# Patient Record
Sex: Male | Born: 1950 | Race: Black or African American | Hispanic: No | Smoking: Never smoker
Health system: Southern US, Community
[De-identification: ages and names within clinical notes are randomized; demographics above are authoritative.]

## PROBLEM LIST (undated history)

## (undated) ENCOUNTER — Emergency Department (HOSPITAL_COMMUNITY): Admission: EM | Payer: Self-pay | Source: Home / Self Care

## (undated) DIAGNOSIS — I10 Essential (primary) hypertension: Secondary | ICD-10-CM

## (undated) DIAGNOSIS — E669 Obesity, unspecified: Secondary | ICD-10-CM

## (undated) DIAGNOSIS — Z930 Tracheostomy status: Secondary | ICD-10-CM

## (undated) DIAGNOSIS — I4891 Unspecified atrial fibrillation: Secondary | ICD-10-CM

## (undated) DIAGNOSIS — J449 Chronic obstructive pulmonary disease, unspecified: Secondary | ICD-10-CM

## (undated) DIAGNOSIS — N289 Disorder of kidney and ureter, unspecified: Secondary | ICD-10-CM

## (undated) DIAGNOSIS — I82409 Acute embolism and thrombosis of unspecified deep veins of unspecified lower extremity: Secondary | ICD-10-CM

## (undated) DIAGNOSIS — K219 Gastro-esophageal reflux disease without esophagitis: Secondary | ICD-10-CM

---

## 2018-05-06 ENCOUNTER — Ambulatory Visit (HOSPITAL_COMMUNITY)
Admission: RE | Admit: 2018-05-06 | Discharge: 2018-05-06 | Disposition: A | Discharge status: Home / Self Care | Payer: Medicare Other | Source: Ambulatory Visit | Attending: Physical Medicine and Rehabilitation | Admitting: Physical Medicine and Rehabilitation

## 2018-05-06 ENCOUNTER — Other Ambulatory Visit (HOSPITAL_COMMUNITY): Payer: Self-pay | Admitting: Physical Medicine and Rehabilitation

## 2018-05-06 DIAGNOSIS — J90 Pleural effusion, not elsewhere classified: Secondary | ICD-10-CM | POA: Insufficient documentation

## 2018-05-06 DIAGNOSIS — J9811 Atelectasis: Secondary | ICD-10-CM | POA: Insufficient documentation

## 2018-05-06 DIAGNOSIS — I700 Atherosclerosis of aorta: Secondary | ICD-10-CM | POA: Insufficient documentation

## 2018-05-06 DIAGNOSIS — J9620 Acute and chronic respiratory failure, unspecified whether with hypoxia or hypercapnia: Secondary | ICD-10-CM | POA: Diagnosis not present

## 2018-05-06 DIAGNOSIS — R1084 Generalized abdominal pain: Secondary | ICD-10-CM

## 2018-05-06 DIAGNOSIS — R140 Abdominal distension (gaseous): Secondary | ICD-10-CM

## 2018-05-10 DIAGNOSIS — N183 Chronic kidney disease, stage 3 (moderate): Secondary | ICD-10-CM | POA: Diagnosis not present

## 2018-05-10 DIAGNOSIS — J449 Chronic obstructive pulmonary disease, unspecified: Secondary | ICD-10-CM

## 2018-05-10 DIAGNOSIS — J9620 Acute and chronic respiratory failure, unspecified whether with hypoxia or hypercapnia: Secondary | ICD-10-CM

## 2018-05-10 DIAGNOSIS — G4733 Obstructive sleep apnea (adult) (pediatric): Secondary | ICD-10-CM

## 2018-05-11 DIAGNOSIS — G4733 Obstructive sleep apnea (adult) (pediatric): Secondary | ICD-10-CM

## 2018-05-11 DIAGNOSIS — J449 Chronic obstructive pulmonary disease, unspecified: Secondary | ICD-10-CM | POA: Diagnosis not present

## 2018-05-11 DIAGNOSIS — N183 Chronic kidney disease, stage 3 (moderate): Secondary | ICD-10-CM

## 2018-05-11 DIAGNOSIS — J9620 Acute and chronic respiratory failure, unspecified whether with hypoxia or hypercapnia: Secondary | ICD-10-CM

## 2018-05-12 ENCOUNTER — Ambulatory Visit (HOSPITAL_COMMUNITY): Payer: Self-pay

## 2018-05-12 DIAGNOSIS — J9620 Acute and chronic respiratory failure, unspecified whether with hypoxia or hypercapnia: Secondary | ICD-10-CM

## 2018-05-12 DIAGNOSIS — N183 Chronic kidney disease, stage 3 (moderate): Secondary | ICD-10-CM

## 2018-05-12 DIAGNOSIS — J449 Chronic obstructive pulmonary disease, unspecified: Secondary | ICD-10-CM | POA: Diagnosis not present

## 2018-05-12 DIAGNOSIS — G4733 Obstructive sleep apnea (adult) (pediatric): Secondary | ICD-10-CM

## 2018-05-13 DIAGNOSIS — J9620 Acute and chronic respiratory failure, unspecified whether with hypoxia or hypercapnia: Secondary | ICD-10-CM

## 2018-05-13 DIAGNOSIS — G4733 Obstructive sleep apnea (adult) (pediatric): Secondary | ICD-10-CM | POA: Diagnosis not present

## 2018-05-13 DIAGNOSIS — J449 Chronic obstructive pulmonary disease, unspecified: Secondary | ICD-10-CM

## 2018-05-13 DIAGNOSIS — N183 Chronic kidney disease, stage 3 (moderate): Secondary | ICD-10-CM

## 2018-05-14 DIAGNOSIS — J9620 Acute and chronic respiratory failure, unspecified whether with hypoxia or hypercapnia: Secondary | ICD-10-CM

## 2018-05-14 DIAGNOSIS — N183 Chronic kidney disease, stage 3 (moderate): Secondary | ICD-10-CM

## 2018-05-14 DIAGNOSIS — G4733 Obstructive sleep apnea (adult) (pediatric): Secondary | ICD-10-CM

## 2018-05-14 DIAGNOSIS — J449 Chronic obstructive pulmonary disease, unspecified: Secondary | ICD-10-CM | POA: Diagnosis not present

## 2018-05-15 DIAGNOSIS — J449 Chronic obstructive pulmonary disease, unspecified: Secondary | ICD-10-CM | POA: Diagnosis not present

## 2018-05-15 DIAGNOSIS — N183 Chronic kidney disease, stage 3 (moderate): Secondary | ICD-10-CM | POA: Diagnosis not present

## 2018-05-15 DIAGNOSIS — G4733 Obstructive sleep apnea (adult) (pediatric): Secondary | ICD-10-CM

## 2018-05-15 DIAGNOSIS — J9620 Acute and chronic respiratory failure, unspecified whether with hypoxia or hypercapnia: Secondary | ICD-10-CM

## 2018-05-16 DIAGNOSIS — G4733 Obstructive sleep apnea (adult) (pediatric): Secondary | ICD-10-CM

## 2018-05-16 DIAGNOSIS — J9620 Acute and chronic respiratory failure, unspecified whether with hypoxia or hypercapnia: Secondary | ICD-10-CM | POA: Diagnosis not present

## 2018-05-16 DIAGNOSIS — N183 Chronic kidney disease, stage 3 (moderate): Secondary | ICD-10-CM | POA: Diagnosis not present

## 2018-05-16 DIAGNOSIS — J449 Chronic obstructive pulmonary disease, unspecified: Secondary | ICD-10-CM

## 2018-05-24 DIAGNOSIS — J9620 Acute and chronic respiratory failure, unspecified whether with hypoxia or hypercapnia: Secondary | ICD-10-CM

## 2018-05-24 DIAGNOSIS — N183 Chronic kidney disease, stage 3 (moderate): Secondary | ICD-10-CM

## 2018-05-24 DIAGNOSIS — J449 Chronic obstructive pulmonary disease, unspecified: Secondary | ICD-10-CM

## 2018-05-24 DIAGNOSIS — G4733 Obstructive sleep apnea (adult) (pediatric): Secondary | ICD-10-CM | POA: Diagnosis not present

## 2018-05-25 DIAGNOSIS — N183 Chronic kidney disease, stage 3 (moderate): Secondary | ICD-10-CM | POA: Diagnosis not present

## 2018-05-25 DIAGNOSIS — J9620 Acute and chronic respiratory failure, unspecified whether with hypoxia or hypercapnia: Secondary | ICD-10-CM

## 2018-05-25 DIAGNOSIS — J449 Chronic obstructive pulmonary disease, unspecified: Secondary | ICD-10-CM | POA: Diagnosis not present

## 2018-05-25 DIAGNOSIS — G4733 Obstructive sleep apnea (adult) (pediatric): Secondary | ICD-10-CM | POA: Diagnosis not present

## 2018-05-26 DIAGNOSIS — J449 Chronic obstructive pulmonary disease, unspecified: Secondary | ICD-10-CM | POA: Diagnosis not present

## 2018-05-26 DIAGNOSIS — N183 Chronic kidney disease, stage 3 (moderate): Secondary | ICD-10-CM

## 2018-05-26 DIAGNOSIS — J9620 Acute and chronic respiratory failure, unspecified whether with hypoxia or hypercapnia: Secondary | ICD-10-CM | POA: Diagnosis not present

## 2018-05-26 DIAGNOSIS — G4733 Obstructive sleep apnea (adult) (pediatric): Secondary | ICD-10-CM | POA: Diagnosis not present

## 2018-05-27 DIAGNOSIS — G4733 Obstructive sleep apnea (adult) (pediatric): Secondary | ICD-10-CM

## 2018-05-27 DIAGNOSIS — J9620 Acute and chronic respiratory failure, unspecified whether with hypoxia or hypercapnia: Secondary | ICD-10-CM

## 2018-05-27 DIAGNOSIS — J449 Chronic obstructive pulmonary disease, unspecified: Secondary | ICD-10-CM

## 2018-05-27 DIAGNOSIS — N183 Chronic kidney disease, stage 3 (moderate): Secondary | ICD-10-CM | POA: Diagnosis not present

## 2018-05-28 DIAGNOSIS — J449 Chronic obstructive pulmonary disease, unspecified: Secondary | ICD-10-CM

## 2018-05-28 DIAGNOSIS — N183 Chronic kidney disease, stage 3 (moderate): Secondary | ICD-10-CM

## 2018-05-28 DIAGNOSIS — G4733 Obstructive sleep apnea (adult) (pediatric): Secondary | ICD-10-CM

## 2018-05-28 DIAGNOSIS — J9620 Acute and chronic respiratory failure, unspecified whether with hypoxia or hypercapnia: Secondary | ICD-10-CM | POA: Diagnosis not present

## 2018-05-29 DIAGNOSIS — J449 Chronic obstructive pulmonary disease, unspecified: Secondary | ICD-10-CM

## 2018-05-29 DIAGNOSIS — J9620 Acute and chronic respiratory failure, unspecified whether with hypoxia or hypercapnia: Secondary | ICD-10-CM

## 2018-05-29 DIAGNOSIS — N183 Chronic kidney disease, stage 3 (moderate): Secondary | ICD-10-CM | POA: Diagnosis not present

## 2018-05-29 DIAGNOSIS — G4733 Obstructive sleep apnea (adult) (pediatric): Secondary | ICD-10-CM | POA: Diagnosis not present

## 2018-05-30 DIAGNOSIS — G4733 Obstructive sleep apnea (adult) (pediatric): Secondary | ICD-10-CM

## 2018-05-30 DIAGNOSIS — J9620 Acute and chronic respiratory failure, unspecified whether with hypoxia or hypercapnia: Secondary | ICD-10-CM | POA: Diagnosis not present

## 2018-05-30 DIAGNOSIS — J449 Chronic obstructive pulmonary disease, unspecified: Secondary | ICD-10-CM

## 2018-05-30 DIAGNOSIS — N183 Chronic kidney disease, stage 3 (moderate): Secondary | ICD-10-CM | POA: Diagnosis not present

## 2018-07-08 ENCOUNTER — Encounter (HOSPITAL_COMMUNITY): Payer: Self-pay | Admitting: Emergency Medicine

## 2018-07-08 ENCOUNTER — Emergency Department (HOSPITAL_COMMUNITY)
Admission: EM | Admit: 2018-07-08 | Discharge: 2018-07-08 | Disposition: A | Discharge status: Home / Self Care | Payer: Medicare Other | Attending: Emergency Medicine | Admitting: Emergency Medicine

## 2018-07-08 ENCOUNTER — Emergency Department (HOSPITAL_COMMUNITY): Payer: Medicare Other

## 2018-07-08 ENCOUNTER — Other Ambulatory Visit: Payer: Self-pay

## 2018-07-08 DIAGNOSIS — Y658 Other specified misadventures during surgical and medical care: Secondary | ICD-10-CM | POA: Diagnosis not present

## 2018-07-08 DIAGNOSIS — N189 Chronic kidney disease, unspecified: Secondary | ICD-10-CM | POA: Diagnosis not present

## 2018-07-08 DIAGNOSIS — R140 Abdominal distension (gaseous): Secondary | ICD-10-CM | POA: Diagnosis not present

## 2018-07-08 DIAGNOSIS — T85598A Other mechanical complication of other gastrointestinal prosthetic devices, implants and grafts, initial encounter: Secondary | ICD-10-CM | POA: Diagnosis not present

## 2018-07-08 DIAGNOSIS — R112 Nausea with vomiting, unspecified: Secondary | ICD-10-CM

## 2018-07-08 DIAGNOSIS — I129 Hypertensive chronic kidney disease with stage 1 through stage 4 chronic kidney disease, or unspecified chronic kidney disease: Secondary | ICD-10-CM | POA: Insufficient documentation

## 2018-07-08 DIAGNOSIS — K6389 Other specified diseases of intestine: Secondary | ICD-10-CM | POA: Insufficient documentation

## 2018-07-08 DIAGNOSIS — J449 Chronic obstructive pulmonary disease, unspecified: Secondary | ICD-10-CM | POA: Diagnosis not present

## 2018-07-08 DIAGNOSIS — Z931 Gastrostomy status: Secondary | ICD-10-CM | POA: Insufficient documentation

## 2018-07-08 HISTORY — DX: Obesity, unspecified: E66.9

## 2018-07-08 HISTORY — DX: Essential (primary) hypertension: I10

## 2018-07-08 HISTORY — DX: Gastro-esophageal reflux disease without esophagitis: K21.9

## 2018-07-08 HISTORY — DX: Chronic obstructive pulmonary disease, unspecified: J44.9

## 2018-07-08 HISTORY — DX: Acute embolism and thrombosis of unspecified deep veins of unspecified lower extremity: I82.409

## 2018-07-08 HISTORY — DX: Tracheostomy status: Z93.0

## 2018-07-08 HISTORY — DX: Unspecified atrial fibrillation: I48.91

## 2018-07-08 HISTORY — DX: Disorder of kidney and ureter, unspecified: N28.9

## 2018-07-08 LAB — COMPREHENSIVE METABOLIC PANEL
ALT: 20 U/L (ref 0–44)
ANION GAP: 16 — AB (ref 5–15)
AST: 27 U/L (ref 15–41)
Albumin: 3.6 g/dL (ref 3.5–5.0)
Alkaline Phosphatase: 124 U/L (ref 38–126)
BUN: 23 mg/dL (ref 8–23)
CALCIUM: 9.4 mg/dL (ref 8.9–10.3)
CHLORIDE: 98 mmol/L (ref 98–111)
CO2: 27 mmol/L (ref 22–32)
Creatinine, Ser: 1.95 mg/dL — ABNORMAL HIGH (ref 0.61–1.24)
GFR calc non Af Amer: 34 mL/min — ABNORMAL LOW (ref >60)
GFR, EST AFRICAN AMERICAN: 39 mL/min — AB (ref >60)
Glucose, Bld: 111 mg/dL — ABNORMAL HIGH (ref 70–99)
POTASSIUM: 5.2 mmol/L — AB (ref 3.5–5.1)
SODIUM: 141 mmol/L (ref 135–145)
Total Bilirubin: 1 mg/dL (ref 0.3–1.2)
Total Protein: 8.3 g/dL — ABNORMAL HIGH (ref 6.5–8.1)

## 2018-07-08 LAB — CBC
HCT: 41.9 % (ref 39.0–52.0)
HEMOGLOBIN: 12.9 g/dL — AB (ref 13.0–17.0)
MCH: 26.8 pg (ref 26.0–34.0)
MCHC: 30.8 g/dL (ref 30.0–36.0)
MCV: 86.9 fL (ref 78.0–100.0)
PLATELETS: 461 10*3/uL — AB (ref 150–400)
RBC: 4.82 MIL/uL (ref 4.22–5.81)
RDW: 17.2 % — ABNORMAL HIGH (ref 11.5–15.5)
WBC: 8.7 10*3/uL (ref 4.0–10.5)

## 2018-07-08 LAB — LIPASE, BLOOD: LIPASE: 31 U/L (ref 11–51)

## 2018-07-08 LAB — I-STAT CG4 LACTIC ACID, ED: Lactic Acid, Venous: 1.29 mmol/L (ref 0.5–1.9)

## 2018-07-08 MED ORDER — ONDANSETRON HCL 4 MG/2ML IJ SOLN
4.0000 mg | Freq: Once | INTRAMUSCULAR | Status: AC
  Administered 2018-07-08: 4 mg via INTRAVENOUS
  Filled 2018-07-08: qty 2

## 2018-07-08 MED ORDER — SODIUM CHLORIDE 0.9 % IV BOLUS
1000.0000 mL | Freq: Once | INTRAVENOUS | Status: AC
  Administered 2018-07-08: 1000 mL via INTRAVENOUS

## 2018-07-08 NOTE — Progress Notes (Signed)
Patient came in [facility] with a trach size #6 XLT, placed on above vent settings as per facility. SATS 100%, RR 28, BBS clear - diminished.

## 2018-07-08 NOTE — ED Triage Notes (Signed)
Arrived from [facility] for abdominal pain, emesis, and constipation. Patient alert answering and following commands appropriate. Ventilated patient trach, NG tube, and foley catheter in place prior to arrival.

## 2018-07-08 NOTE — ED Provider Notes (Signed)
[facility] Provider Note   CSN: [id number] Arrival date & time: 07/08/18  1305     History   Chief Complaint Chief Complaint  Patient presents with  . Abdominal Pain  . Emesis    HPI Alex Reilly is a 67 y.o. male.  67 year old male with extensive past medical history including atrial fibrillation, COPD, chronic respiratory failure s/p trach, g-tube dependence, CKD, HTN who p/w vomiting.  History limited due to the fact that the patient has trach. [facility] has reportedly had nausea, vomiting over the past day despite having an NG tube in place.  At one point we received a report of diarrhea but the patient states that he has not had a bowel movement in a long time.  He has significant abdominal distention and has been having some abdominal pain although he denies any significant pain currently.  LEVEL 5 CAVEAT DUE TO DIFFICULTY WITH SPEECH FROM TRACHEOSTOMY  The history is provided by the patient, the EMS personnel and the nursing home.  Abdominal Pain   Associated symptoms include vomiting.  Emesis   Associated symptoms include abdominal pain.    Past Medical History:  Diagnosis Date  . Atrial fibrillation (HCC)   . COPD (chronic obstructive pulmonary disease) (HCC)   . DVT (deep venous thrombosis) (HCC)   . GERD (gastroesophageal reflux disease)   . Hypertension   . Obesity   . Renal disorder   . Tracheostomy in place [facility](HCC)     There are no active problems to display for this patient.         Home Medications    Prior to Admission medications   Not on File    Family History No family history on file.  Social History Social History   Tobacco Use  . Smoking status: Not on file  Substance Use Topics  . Alcohol use: Not on file  . Drug use: Not on file  no alcohol, drug, or tobacco use Lives at [facility]   Allergies   Patient has no known allergies.   Review  of Systems Review of Systems  Unable to perform ROS: Patient nonverbal  Gastrointestinal: Positive for abdominal pain and vomiting.  difficulty with speech due to tracheostomy   Physical Exam Updated Vital Signs BP 129/82 (BP Location: Left Arm)   Pulse 75   Resp 19   Ht 5\' 8"  (1.727 m)   Wt (!) 163.3 kg (360 lb)   SpO2 97%   BMI 54.74 kg/m   Physical Exam  Constitutional: He is oriented to person, place, and time. He appears well-developed.  Chronically ill appearing, alert, NAD  HENT:  Head: Normocephalic and atraumatic.  Poor dentition, NGT in place  Eyes: Pupils are equal, round, and reactive to light. Conjunctivae are normal.  Neck: Neck supple.  Cardiovascular: Normal rate, regular rhythm and normal heart sounds.  No murmur heard. Pulmonary/Chest: Effort normal.  Coarse breath sounds b/l, on vent with trach in place  Abdominal: He exhibits distension (severe). Bowel sounds are decreased. There is no tenderness.  Musculoskeletal: He exhibits no edema.  Neurological: He is alert and oriented to person, place, and time.  Fluent speech  Skin: Skin is warm and dry.  Psychiatric: He has a normal mood and affect. Judgment normal.  Nursing note and vitals reviewed.    ED Treatments / Results  Labs (all labs ordered are listed, but only abnormal results are displayed) Labs Reviewed  COMPREHENSIVE METABOLIC PANEL - Abnormal; Notable for the following components:      Result Value   Potassium 5.2 (*)    Glucose, Bld 111 (*)    Creatinine, Ser 1.95 (*)    Total Protein 8.3 (*)    GFR calc non Af Amer 34 (*)    GFR calc Af Amer 39 (*)    Anion gap 16 (*)    All other components within normal limits  CBC - Abnormal; Notable for the following components:   Hemoglobin 12.9 (*)    RDW 17.2 (*)    Platelets 461 (*)    All other components within normal limits  LIPASE, BLOOD  I-STAT CG4 LACTIC ACID, ED    EKG EKG Interpretation  Date/Time:  Thursday July 08 2018  13:10:20 EDT Ventricular Rate:  93 PR Interval:    QRS Duration: 110 QT Interval:  420 QTC Calculation: 512 R Axis:   -71 Text Interpretation:  Sinus rhythm Paired ventricular premature complexes Consider left atrial enlargement Left axis deviation Low voltage, precordial leads Abnormal R-wave progression, late transition Prolonged QT interval No previous ECGs available Confirmed by Frederick Peers [phone]) on 07/08/2018 2:24:07 PM   Radiology Dg Chest Portable 1 View  Result Date: 07/08/2018 CLINICAL DATA:  Nasogastric tube placement for vomiting EXAM: PORTABLE CHEST 1 VIEW COMPARISON:  None. FINDINGS: Nasogastric tube tip is at the level of the gastroesophageal junction. Tracheostomy catheter tip is 5.8 cm above the carina. No pneumothorax. There is bibasilar atelectasis. No consolidation or edema evident. Heart is mildly enlarged with pulmonary vascularity normal. No adenopathy. There is aortic atherosclerosis. No bone lesions. IMPRESSION: Nasogastric tube tip is at the gastroesophageal junction. Advise advancing nasogastric tube 10-12 cm. Tracheostomy tip is 5.8 cm above the carina. No pneumothorax. Heart is mildly enlarged.  Pulmonary vascularity normal. Bibasilar atelectasis.  No edema or consolidation. There is aortic atherosclerosis. Aortic Atherosclerosis (ICD10-I70.0). Electronically Signed   By: Bretta Bang III M.D.   On: 07/08/2018 13:41   Dg Abd Portable 1 View  Result Date: 07/08/2018 CLINICAL DATA:  NG tube advancement. EXAM: PORTABLE ABDOMEN - 1 VIEW COMPARISON:  07/08/2018 at 1328 hours FINDINGS: The enteric tube has been advanced and now projects over the proximal stomach with side hole also below the hemidiaphragm. Marked gaseous dilatation of the colon and milder small-bowel dilatation are again partially visualized. An IVC filter is noted. IMPRESSION: Advancement of enteric tube into the proximal stomach. Electronically Signed   By: Sebastian Ache M.D.   On: 07/08/2018 14:50    Dg Abd Portable 1 View  Result Date: 07/08/2018 CLINICAL DATA:  NG tube placement. EXAM: PORTABLE ABDOMEN - 1 VIEW COMPARISON:  CT, 05/06/2018 FINDINGS: NG tube tip projects at the level of the gastroesophageal junction. Stomach is moderately distended with air. Recommend inserting the 2 another 15-20 cm for more optimal positioning well within the stomach. There are dilated loops small bowel. IMPRESSION: NG tube tip projects at the gastroesophageal junction. Recommend inserting 15-24 cm. Gastric distention and small bowel dilation which suggests a partial small bowel obstruction. Bowel is incompletely imaged. Electronically Signed   By: Amie Portland M.D.   On: 07/08/2018 13:43    Procedures Procedures (including critical care time)  Medications Ordered in ED Medications  sodium chloride 0.9 % bolus 1,000 mL (1,000 mLs Intravenous New Bag/Given 07/08/18 1449)  ondansetron (ZOFRAN) injection 4 mg (4 mg Intravenous Given 07/08/18 1335)     Initial Impression / Assessment and Plan / ED Course  I have reviewed the triage vital signs and the nursing notes.  Pertinent labs & imaging results that were available during my care of the patient were reviewed by me and considered in my medical decision making (see chart for details).    Pt alert, NAD on arrival, stable VS, on vent with no respiratory distress. Severe abd distension noted on exam without focal tenderness. Denied severe abd pain.  Labs show normal lactate, potassium 5.2 but hemolyzed, creatinine 1.95, normal LFTs, reassuring CBC.  Plain films show NG tube at GE junction with gastric distention and small bowel dilatation, suggesting SBO. Advanced NGT with improvement on repeat films. Placed on suction.  I have ordered CT of abdomen and pelvis to evaluate for bowel obstruction, ileus, or other acute process to explain his bowel distention.  I am signing out to the oncoming provider, Dr. Particia Nearing, who will follow up on imaging.  Final  Clinical Impressions(s) / ED Diagnoses   Final diagnoses:  None    ED Discharge Orders    None       Matti Minney, Ambrose Finland, MD 07/08/18 [phone]

## 2018-07-08 NOTE — ED Provider Notes (Signed)
Pt signed out from Dr. Clarene DukeLittle pending result of CT scans.  IMPRESSION: Persistent colonic dilatation similar to that seen on the prior exam involving primarily the transverse colon. No obstructing lesions are seen. Some associated fluid-filled prominent small bowel loops are noted without definitive obstructing change.  This has been going on since at least 5/23 when he had his last CT scan.  I think he was vomiting today b/c his NG was malpositioned.  It is now positioned correctly.  As this is not an acute issue, I don't think he needs to stay, but he will need to see GI.  Return if worse.   Jacalyn LefevreHaviland, Darianny Momon, MD 07/08/18 [phone]

## 2018-07-08 NOTE — ED Notes (Signed)
NG advanced 12 cm per MD orders

## 2018-07-08 NOTE — ED Notes (Signed)
[facility] called to transport pt back to [facility]

## 2018-08-19 ENCOUNTER — Emergency Department (HOSPITAL_COMMUNITY): Payer: Medicare Other

## 2018-08-19 ENCOUNTER — Other Ambulatory Visit: Payer: Self-pay

## 2018-08-19 ENCOUNTER — Inpatient Hospital Stay (HOSPITAL_COMMUNITY)
Admission: EM | Admit: 2018-08-19 | Discharge: 2018-08-26 | DRG: 698 | Disposition: A | Discharge status: Other Acute Inpatient Hospital | Payer: Medicare Other | Attending: Internal Medicine | Admitting: Internal Medicine

## 2018-08-19 ENCOUNTER — Encounter (HOSPITAL_COMMUNITY): Payer: Self-pay

## 2018-08-19 DIAGNOSIS — N183 Chronic kidney disease, stage 3 (moderate): Secondary | ICD-10-CM | POA: Diagnosis not present

## 2018-08-19 DIAGNOSIS — B9620 Unspecified Escherichia coli [E. coli] as the cause of diseases classified elsewhere: Secondary | ICD-10-CM | POA: Diagnosis present

## 2018-08-19 DIAGNOSIS — Y846 Urinary catheterization as the cause of abnormal reaction of the patient, or of later complication, without mention of misadventure at the time of the procedure: Secondary | ICD-10-CM | POA: Diagnosis present

## 2018-08-19 DIAGNOSIS — Z930 Tracheostomy status: Secondary | ICD-10-CM | POA: Diagnosis not present

## 2018-08-19 DIAGNOSIS — Z9911 Dependence on respirator [ventilator] status: Secondary | ICD-10-CM

## 2018-08-19 DIAGNOSIS — J9611 Chronic respiratory failure with hypoxia: Secondary | ICD-10-CM | POA: Diagnosis not present

## 2018-08-19 DIAGNOSIS — Z7982 Long term (current) use of aspirin: Secondary | ICD-10-CM

## 2018-08-19 DIAGNOSIS — M109 Gout, unspecified: Secondary | ICD-10-CM | POA: Diagnosis not present

## 2018-08-19 DIAGNOSIS — Z7951 Long term (current) use of inhaled steroids: Secondary | ICD-10-CM | POA: Diagnosis not present

## 2018-08-19 DIAGNOSIS — K567 Ileus, unspecified: Secondary | ICD-10-CM | POA: Diagnosis present

## 2018-08-19 DIAGNOSIS — A419 Sepsis, unspecified organism: Secondary | ICD-10-CM | POA: Diagnosis present

## 2018-08-19 DIAGNOSIS — N390 Urinary tract infection, site not specified: Secondary | ICD-10-CM | POA: Diagnosis not present

## 2018-08-19 DIAGNOSIS — Z79890 Hormone replacement therapy (postmenopausal): Secondary | ICD-10-CM

## 2018-08-19 DIAGNOSIS — Z6841 Body Mass Index (BMI) 40.0 and over, adult: Secondary | ICD-10-CM

## 2018-08-19 DIAGNOSIS — Z888 Allergy status to other drugs, medicaments and biological substances status: Secondary | ICD-10-CM

## 2018-08-19 DIAGNOSIS — T83511A Infection and inflammatory reaction due to indwelling urethral catheter, initial encounter: Principal | ICD-10-CM | POA: Diagnosis present

## 2018-08-19 DIAGNOSIS — E876 Hypokalemia: Secondary | ICD-10-CM | POA: Diagnosis present

## 2018-08-19 DIAGNOSIS — J449 Chronic obstructive pulmonary disease, unspecified: Secondary | ICD-10-CM | POA: Diagnosis not present

## 2018-08-19 DIAGNOSIS — E860 Dehydration: Secondary | ICD-10-CM | POA: Diagnosis not present

## 2018-08-19 DIAGNOSIS — Z23 Encounter for immunization: Secondary | ICD-10-CM | POA: Diagnosis not present

## 2018-08-19 DIAGNOSIS — I480 Paroxysmal atrial fibrillation: Secondary | ICD-10-CM | POA: Diagnosis not present

## 2018-08-19 DIAGNOSIS — D649 Anemia, unspecified: Secondary | ICD-10-CM | POA: Diagnosis present

## 2018-08-19 DIAGNOSIS — I129 Hypertensive chronic kidney disease with stage 1 through stage 4 chronic kidney disease, or unspecified chronic kidney disease: Secondary | ICD-10-CM | POA: Diagnosis present

## 2018-08-19 DIAGNOSIS — R6521 Severe sepsis with septic shock: Secondary | ICD-10-CM | POA: Diagnosis present

## 2018-08-19 DIAGNOSIS — Z79899 Other long term (current) drug therapy: Secondary | ICD-10-CM

## 2018-08-19 DIAGNOSIS — K219 Gastro-esophageal reflux disease without esophagitis: Secondary | ICD-10-CM | POA: Diagnosis present

## 2018-08-19 DIAGNOSIS — Z86718 Personal history of other venous thrombosis and embolism: Secondary | ICD-10-CM

## 2018-08-19 DIAGNOSIS — Z885 Allergy status to narcotic agent status: Secondary | ICD-10-CM

## 2018-08-19 DIAGNOSIS — N289 Disorder of kidney and ureter, unspecified: Secondary | ICD-10-CM

## 2018-08-19 LAB — URINALYSIS, ROUTINE W REFLEX MICROSCOPIC
BILIRUBIN URINE: NEGATIVE
Glucose, UA: NEGATIVE mg/dL
Ketones, ur: NEGATIVE mg/dL
NITRITE: POSITIVE — AB
PH: 7 (ref 5.0–8.0)
Protein, ur: 30 mg/dL — AB
SPECIFIC GRAVITY, URINE: 1.015 (ref 1.005–1.030)

## 2018-08-19 LAB — COMPREHENSIVE METABOLIC PANEL
ALT: 29 U/L (ref 0–44)
AST: 29 U/L (ref 15–41)
Albumin: 2.9 g/dL — ABNORMAL LOW (ref 3.5–5.0)
Alkaline Phosphatase: 213 U/L — ABNORMAL HIGH (ref 38–126)
Anion gap: 11 (ref 5–15)
BUN: 19 mg/dL (ref 8–23)
CHLORIDE: 105 mmol/L (ref 98–111)
CO2: 25 mmol/L (ref 22–32)
Calcium: 8.8 mg/dL — ABNORMAL LOW (ref 8.9–10.3)
Creatinine, Ser: 1.58 mg/dL — ABNORMAL HIGH (ref 0.61–1.24)
GFR calc Af Amer: 51 mL/min — ABNORMAL LOW (ref >60)
GFR, EST NON AFRICAN AMERICAN: 44 mL/min — AB (ref >60)
Glucose, Bld: 102 mg/dL — ABNORMAL HIGH (ref 70–99)
Potassium: 4.1 mmol/L (ref 3.5–5.1)
Sodium: 141 mmol/L (ref 135–145)
Total Bilirubin: 0.9 mg/dL (ref 0.3–1.2)
Total Protein: 7.1 g/dL (ref 6.5–8.1)

## 2018-08-19 LAB — I-STAT ARTERIAL BLOOD GAS, ED
ACID-BASE EXCESS: 2 mmol/L (ref 0.0–2.0)
BICARBONATE: 25.6 mmol/L (ref 20.0–28.0)
O2 SAT: 100 %
TCO2: 27 mmol/L (ref 22–32)
pCO2 arterial: 38.9 mmHg (ref 32.0–48.0)
pH, Arterial: 7.435 (ref 7.350–7.450)
pO2, Arterial: 404 mmHg — ABNORMAL HIGH (ref 83.0–108.0)

## 2018-08-19 LAB — CBC WITH DIFFERENTIAL/PLATELET
ABS IMMATURE GRANULOCYTES: 0.1 10*3/uL (ref 0.0–0.1)
BASOS ABS: 0 10*3/uL (ref 0.0–0.1)
Basophils Relative: 0 %
Eosinophils Absolute: 0 10*3/uL (ref 0.0–0.7)
Eosinophils Relative: 0 %
HCT: 35.5 % — ABNORMAL LOW (ref 39.0–52.0)
HEMOGLOBIN: 11 g/dL — AB (ref 13.0–17.0)
Immature Granulocytes: 0 %
LYMPHS PCT: 2 %
Lymphs Abs: 0.3 10*3/uL — ABNORMAL LOW (ref 0.7–4.0)
MCH: 27.3 pg (ref 26.0–34.0)
MCHC: 31 g/dL (ref 30.0–36.0)
MCV: 88.1 fL (ref 78.0–100.0)
Monocytes Absolute: 0.2 10*3/uL (ref 0.1–1.0)
Monocytes Relative: 2 %
Neutro Abs: 12.2 10*3/uL — ABNORMAL HIGH (ref 1.7–7.7)
Neutrophils Relative %: 96 %
Platelets: 298 10*3/uL (ref 150–400)
RBC: 4.03 MIL/uL — ABNORMAL LOW (ref 4.22–5.81)
RDW: 16.7 % — ABNORMAL HIGH (ref 11.5–15.5)
WBC: 12.9 10*3/uL — ABNORMAL HIGH (ref 4.0–10.5)

## 2018-08-19 LAB — I-STAT CG4 LACTIC ACID, ED
LACTIC ACID, VENOUS: 1.6 mmol/L (ref 0.5–1.9)
Lactic Acid, Venous: 1.17 mmol/L (ref 0.5–1.9)

## 2018-08-19 LAB — PROTIME-INR
INR: 1.08
PROTHROMBIN TIME: 13.9 s (ref 11.4–15.2)

## 2018-08-19 MED ORDER — SODIUM CHLORIDE 0.9 % IV SOLN
2.0000 g | Freq: Once | INTRAVENOUS | Status: AC
  Administered 2018-08-19: 2 g via INTRAVENOUS
  Filled 2018-08-19: qty 2

## 2018-08-19 MED ORDER — IOPAMIDOL (ISOVUE-300) INJECTION 61%
100.0000 mL | Freq: Once | INTRAVENOUS | Status: AC | PRN
  Administered 2018-08-19: 100 mL via INTRAVENOUS

## 2018-08-19 MED ORDER — IOPAMIDOL (ISOVUE-300) INJECTION 61%
INTRAVENOUS | Status: AC
  Filled 2018-08-19: qty 100

## 2018-08-19 MED ORDER — VANCOMYCIN HCL 10 G IV SOLR
2500.0000 mg | Freq: Once | INTRAVENOUS | Status: AC
  Administered 2018-08-19: 2500 mg via INTRAVENOUS
  Filled 2018-08-19: qty 2500

## 2018-08-19 MED ORDER — ACETAMINOPHEN 650 MG RE SUPP
650.0000 mg | Freq: Once | RECTAL | Status: AC
  Administered 2018-08-19: 650 mg via RECTAL
  Filled 2018-08-19: qty 1

## 2018-08-19 MED ORDER — SODIUM CHLORIDE 0.9 % IV SOLN
2.0000 g | Freq: Three times a day (TID) | INTRAVENOUS | Status: DC
  Administered 2018-08-20 – 2018-08-22 (×7): 2 g via INTRAVENOUS
  Filled 2018-08-19 (×8): qty 2

## 2018-08-19 MED ORDER — VANCOMYCIN HCL 10 G IV SOLR
1750.0000 mg | INTRAVENOUS | Status: DC
  Administered 2018-08-20: 1750 mg via INTRAVENOUS
  Filled 2018-08-19 (×2): qty 1750

## 2018-08-19 MED ORDER — SODIUM CHLORIDE 0.9 % IV BOLUS
700.0000 mL | Freq: Once | INTRAVENOUS | Status: AC
  Administered 2018-08-19: 700 mL via INTRAVENOUS

## 2018-08-19 MED ORDER — SODIUM CHLORIDE 0.9 % IV BOLUS
1000.0000 mL | Freq: Once | INTRAVENOUS | Status: AC
  Administered 2018-08-19: 1000 mL via INTRAVENOUS

## 2018-08-19 MED ORDER — METRONIDAZOLE IN NACL 5-0.79 MG/ML-% IV SOLN
500.0000 mg | Freq: Three times a day (TID) | INTRAVENOUS | Status: DC
  Administered 2018-08-19: 500 mg via INTRAVENOUS
  Filled 2018-08-19 (×2): qty 100

## 2018-08-19 MED ORDER — VANCOMYCIN HCL IN DEXTROSE 1-5 GM/200ML-% IV SOLN: 1000.0000 mg | Freq: Once | INTRAVENOUS | Status: DC

## 2018-08-19 NOTE — ED Notes (Signed)
Re paged critical care

## 2018-08-19 NOTE — ED Provider Notes (Signed)
[facility] EMERGENCY DEPARTMENT Provider Note   CSN: [id number] Arrival date & time: 08/19/18  1853     History   Chief Complaint Chief Complaint  Patient presents with  . Fever    HPI Alex Reilly is a 67 y.o. male.  Patient is a 67 year old male with a history of COPD, respiratory failure status post tracheostomy.  He is trach dependent.  He has a history of hypertension, atrial fibrillation and chronic abdominal distention.  He receives feedings by an NG tube.  He resides at [facility].  He was noted today to have a fever of 103.  His NG tube started backing up and he had some vomiting.  He has chronic abdominal distention and states it is not any different than his baseline.  He denies any abdominal pain.  He has had a little bit of a cough.  He states he is having normal bowel movements.  He has a chronic indwelling Foley catheter but denies any bladder pain.  History is a little bit limited as he is nonverbal given his trach.  He complains of a headache but is denying any other complaints of pain.     Past Medical History:  Diagnosis Date  . Atrial fibrillation (HCC)   . COPD (chronic obstructive pulmonary disease) (HCC)   . DVT (deep venous thrombosis) (HCC)   . GERD (gastroesophageal reflux disease)   . Hypertension   . Obesity   . Renal disorder   . Tracheostomy in place [facility])     There are no active problems to display for this patient.   History reviewed. No pertinent surgical history.      Home Medications    Prior to Admission medications   Not on File    Family History History reviewed. No pertinent family history.  Social History Social History   Tobacco Use  . Smoking status: Never Smoker  . Smokeless tobacco: Never Used  Substance Use Topics  . Alcohol use: Not on file  . Drug use: Not on file     Allergies   Patient has no known allergies.   Review of Systems Review of Systems  Constitutional: Positive  for fever. Negative for chills, diaphoresis and fatigue.  HENT: Negative for congestion, rhinorrhea and sneezing.   Eyes: Negative.   Respiratory: Negative for cough, chest tightness and shortness of breath.   Cardiovascular: Negative for chest pain and leg swelling.  Gastrointestinal: Positive for abdominal distention, nausea and vomiting. Negative for abdominal pain, blood in stool and diarrhea.  Genitourinary: Negative for difficulty urinating, flank pain, frequency and hematuria.  Musculoskeletal: Negative for arthralgias and back pain.  Skin: Negative for rash.  Neurological: Positive for headaches. Negative for dizziness, speech difficulty, weakness and numbness.     Physical Exam Updated Vital Signs BP (!) 94/49   Pulse 70   Temp (!) 102.1 F (38.9 C) (Oral)   Resp (!) 23   Wt (!) 170 kg   SpO2 99%   BMI 56.99 kg/m   Physical Exam  Constitutional: He is oriented to person, place, and time. He appears well-developed and well-nourished.  HENT:  Head: Normocephalic and atraumatic.  Eyes: Pupils are equal, round, and reactive to light.  Neck: Normal range of motion. Neck supple.  Tracheostomy in place, patient is on the vent  Cardiovascular: Regular rhythm and normal heart sounds. Tachycardia present.  Pulmonary/Chest: Effort normal. No respiratory distress. He has no wheezes. He has rales. He exhibits no tenderness.  Abdominal: Soft. Bowel sounds are normal. He exhibits distension. There is no tenderness. There is no rebound and no guarding.  Patient has marked abdominal distention with tympany to percussion.  He denies any pain on exam  Genitourinary:  Genitourinary Comments: No signs of wounds to his scrotal area.  No buttocks wounds are noted  Musculoskeletal: Normal range of motion. He exhibits no edema.  Lymphadenopathy:    He has no cervical adenopathy.  Neurological: He is alert and oriented to person, place, and time.  Skin: Skin is warm and dry. No rash noted.    Psychiatric: He has a normal mood and affect.     ED Treatments / Results  Labs (all labs ordered are listed, but only abnormal results are displayed) Labs Reviewed  COMPREHENSIVE METABOLIC PANEL - Abnormal; Notable for the following components:      Result Value   Glucose, Bld 102 (*)    Creatinine, Ser 1.58 (*)    Calcium 8.8 (*)    Albumin 2.9 (*)    Alkaline Phosphatase 213 (*)    GFR calc non Af Amer 44 (*)    GFR calc Af Amer 51 (*)    All other components within normal limits  CBC WITH DIFFERENTIAL/PLATELET - Abnormal; Notable for the following components:   WBC 12.9 (*)    RBC 4.03 (*)    Hemoglobin 11.0 (*)    HCT 35.5 (*)    RDW 16.7 (*)    Neutro Abs 12.2 (*)    Lymphs Abs 0.3 (*)    All other components within normal limits  URINALYSIS, ROUTINE W REFLEX MICROSCOPIC - Abnormal; Notable for the following components:   APPearance CLOUDY (*)    Hgb urine dipstick MODERATE (*)    Protein, ur 30 (*)    Nitrite POSITIVE (*)    Leukocytes, UA MODERATE (*)    Bacteria, UA MANY (*)    All other components within normal limits  I-STAT ARTERIAL BLOOD GAS, ED - Abnormal; Notable for the following components:   pO2, Arterial 404.0 (*)    All other components within normal limits  CULTURE, BLOOD (ROUTINE X 2)  CULTURE, BLOOD (ROUTINE X 2)  URINE CULTURE  PROTIME-INR  I-STAT CG4 LACTIC ACID, ED  I-STAT CG4 LACTIC ACID, ED    EKG EKG Interpretation  Date/Time:  Thursday August 19 2018 23:05:46 EDT Ventricular Rate:  77 PR Interval:    QRS Duration: 112 QT Interval:  471 QTC Calculation: 534 R Axis:   -31 Text Interpretation:  Sinus rhythm Incomplete RBBB and LAFB Low voltage, extremity and precordial leads Prolonged QT interval Baseline wander in lead(s) I II aVR since last tracing no significant change Confirmed by Rolan Bucco [phone]) on 08/19/2018 11:09:25 PM   Radiology Ct Abdomen Pelvis W Contrast  Addendum Date: 08/19/2018   ADDENDUM REPORT:  08/19/2018 21:25 ADDENDUM: The IMPRESSION should also include: Foley catheter tip and balloon within the urethra. Recommend advancement into the bladder after balloon deflation. Electronically Signed   By: Harmon Pier M.D.   On: 08/19/2018 21:25   Result Date: 08/19/2018 CLINICAL DATA:  67 year old male with acute abdominal pain with fever. EXAM: CT ABDOMEN AND PELVIS WITH CONTRAST TECHNIQUE: Multidetector CT imaging of the abdomen and pelvis was performed using the standard protocol following bolus administration of intravenous contrast. CONTRAST:  ISOVUE-300 IOPAMIDOL (ISOVUE-300) INJECTION 61% COMPARISON:  07/08/2018 and prior CTs FINDINGS: Lower chest: Cardiomegaly, coronary artery disease and bibasilar atelectasis again noted. Hepatobiliary: No significant hepatic  or gallbladder abnormalities. No biliary dilatation. Pancreas: Unremarkable Spleen: Unremarkable Adrenals/Urinary Tract: The kidneys, adrenal glands and bladder are unremarkable. A Foley catheter is noted with tip and inflated balloon in the urethra. Stomach/Bowel: NG tube within the stomach is noted. Again noted is marked dilatation of the transverse colon and filled mainly with gas. Fluid within portions of the ascending and transverse colon are noted. The transverse colon measures up to 16 cm in greatest diameter. No small bowel dilatation identified. No definite bowel wall thickening or inflammatory changes noted. Vascular/Lymphatic: Aortic atherosclerosis. An IVC filter is again noted. No enlarged abdominal or pelvic lymph nodes. Reproductive: No significant prostate abnormalities. Other: No free fluid, abscess or pneumoperitoneum. Musculoskeletal: No acute or suspicious bony abnormalities. IMPRESSION: 1. Unchanged marked dilatation of the colon again noted (primarily transverse) containing gas and some fluid without obstructing cause. This is unchanged from 05/06/2018 likely representing a chronic ileus. No evidence of small bowel  obstruction, abscess or pneumoperitoneum. 2. No acute abnormalities identified. 3. Cardiomegaly, coronary artery disease and unchanged mild bibasilar atelectasis. 4.  Aortic Atherosclerosis (ICD10-I70.0). Electronically Signed: By: Harmon Pier M.D. On: 08/19/2018 21:17   Dg Chest Portable 1 View  Result Date: 08/19/2018 CLINICAL DATA:  67 year old male with new onset fever EXAM: PORTABLE CHEST 1 VIEW COMPARISON:  Prior chest x-ray 07/08/2018 FINDINGS: Stable cardiomegaly. Mediastinal contours are unchanged. The patient is intubated. The endotracheal tube tip is 5.3 cm from the carina. A gastric tube is present. The tip lies below the diaphragm, presumably in the stomach. Similar appearance of the lungs with low inspiratory volumes and chronic elevation of the right hemidiaphragm. Nonspecific bibasilar opacities may reflect atelectasis or infiltrate but appears similar compared to prior. Mild vascular congestion, no overt edema. IMPRESSION: 1. The tip of the endotracheal tube is 5.3 cm above the carina. 2. The tip of the gastric tube lies inferior to the diaphragm, presumably within the stomach 3. Stable appearance of the chest with chronic elevation of the right hemidiaphragm, overall low inspiratory volumes and probable bibasilar atelectasis. Electronically Signed   By: Malachy Moan M.D.   On: 08/19/2018 19:34    Procedures Procedures (including critical care time)  Medications Ordered in ED Medications  metroNIDAZOLE (FLAGYL) IVPB 500 mg (0 mg Intravenous Stopped 08/19/18 2103)  iopamidol (ISOVUE-300) 61 % injection (has no administration in time range)  vancomycin (VANCOCIN) 1,750 mg in sodium chloride 0.9 % 500 mL IVPB (has no administration in time range)  ceFEPIme (MAXIPIME) 2 g in sodium chloride 0.9 % 100 mL IVPB (has no administration in time range)  sodium chloride 0.9 % bolus 1,000 mL (0 mLs Intravenous Stopped 08/19/18 2055)  ceFEPIme (MAXIPIME) 2 g in sodium chloride 0.9 % 100 mL IVPB  (0 g Intravenous Stopped 08/19/18 2132)  vancomycin (VANCOCIN) 2,500 mg in sodium chloride 0.9 % 500 mL IVPB (2,500 mg Intravenous New Bag/Given 08/19/18 2139)  sodium chloride 0.9 % bolus 700 mL (0 mLs Intravenous Stopped 08/19/18 2101)  acetaminophen (TYLENOL) suppository 650 mg (650 mg Rectal Given 08/19/18 2005)  iopamidol (ISOVUE-300) 61 % injection 100 mL (100 mLs Intravenous Contrast Given 08/19/18 2033)     Initial Impression / Assessment and Plan / ED Course  I have reviewed the triage vital signs and the nursing notes.  Pertinent labs & imaging results that were available during my care of the patient were reviewed by me and considered in my medical decision making (see chart for details).     Patient is a 67 year old male  who presents from [facility] with criteria for sepsis.  He is febrile and hypotensive.  Code sepsis was called and he was given broad-spectrum antibiotics and IV fluids.  He was given 30 cc/kg fluid bolus based on his ideal body weight which was 1700 cc total.  His chest x-ray is clear without evidence of pneumonia or aspiration.  His abdomen is markedly distended but CT scan shows no evidence of acute abnormality.  His NG tube was hooked to suction.  He does have evidence of a urinary tract infection and his urine was sent for culture.  I do not see any evidence of cellulitis or other suggestions of infection.  Given that he is vent dependent, CCM was consulted for admission.  He was hypotensive on arrival but his blood pressure has responded to IV fluids.  His white count is elevated.  His creatinine is elevated but similar to prior values.  His other labs are non-concerning.  Pt discussed with Dr. Warrick Parisian.  CRITICAL CARE Performed by: Rolan Bucco Total critical care time: 70 minutes Critical care time was exclusive of separately billable procedures and treating other patients. Critical care was necessary to treat or prevent imminent or life-threatening  deterioration. Critical care was time spent personally by me on the following activities: development of treatment plan with patient and/or surrogate as well as nursing, discussions with consultants, evaluation of patient's response to treatment, examination of patient, obtaining history from patient or surrogate, ordering and performing treatments and interventions, ordering and review of laboratory studies, ordering and review of radiographic studies, pulse oximetry and re-evaluation of patient's condition.   Final Clinical Impressions(s) / ED Diagnoses   Final diagnoses:  Septic shock (HCC)  Urinary tract infection associated with indwelling urethral catheter, initial encounter [facility])    ED Discharge Orders    None       Rolan Bucco, MD 08/19/18 2349

## 2018-08-19 NOTE — ED Triage Notes (Signed)
Patient from [facility] for new onset fever.  Denies shortness of breath or chest pain.  Chronic foley, NG tube and trach in place.  Vent dependent trach.  Per [facility] patient had approximately 500cc of tube feed in mouth.  Patient is A&Ox4 can suction himself.

## 2018-08-19 NOTE — Progress Notes (Signed)
RT and RN transported pt to CT for abd CT. Pt tolerated well no complications. RT will continue to monitor.

## 2018-08-19 NOTE — Progress Notes (Signed)
RT adjusted pts O2 based on ABG results.   ABG results PH 7.44 PCO2 39 PO2 404 HCO3 25.6  Pt was on 100% O2 when ABG done, Titrated O2 down to 40% pt current Sat of 99% on monitor.   RT will continue to monitor.

## 2018-08-19 NOTE — Progress Notes (Signed)
Pharmacy Antibiotic Note  Alex Reilly is a 67 y.o. male admitted on 08/19/2018 with sepsis.  Pharmacy has been consulted for vancomycin and cefepime dosing. Tmax 102.1 WBC 12.9, sCr 1.58, Lac 1.6.  Plan: Vancomycin 2500mg  IV x1, then 1750mg  IV Q24H  Cefepime 2g IV  Q8H F/u LOT/descalation, cultures, renal function, troughs prn   Weight: (!) 374 lb 12.5 oz (170 kg)  Temp (24hrs), Avg:102.1 F (38.9 C), Min:102.1 F (38.9 C), Max:102.1 F (38.9 C)  Recent Labs  Lab 08/19/18 1929  LATICACIDVEN 1.60    CrCl cannot be calculated (Patient's most recent lab result is older than the maximum 21 days allowed.).    No Known Allergies  Antimicrobials this admission: Vanc 9/5  >>  Cefepime 9/5 >> Metronidazole 9/5 >>  Dose adjustments this admission:   Microbiology results:   Thank you for allowing pharmacy to be a part of this patient's care.  Ewing Schlein, PharmD PGY1 Pharmacy Resident 08/19/2018    8:40 PM

## 2018-08-20 ENCOUNTER — Inpatient Hospital Stay (HOSPITAL_COMMUNITY): Payer: Medicare Other

## 2018-08-20 ENCOUNTER — Encounter (HOSPITAL_COMMUNITY): Payer: Self-pay | Admitting: Internal Medicine

## 2018-08-20 DIAGNOSIS — I129 Hypertensive chronic kidney disease with stage 1 through stage 4 chronic kidney disease, or unspecified chronic kidney disease: Secondary | ICD-10-CM | POA: Diagnosis present

## 2018-08-20 DIAGNOSIS — Z23 Encounter for immunization: Secondary | ICD-10-CM | POA: Diagnosis present

## 2018-08-20 DIAGNOSIS — J449 Chronic obstructive pulmonary disease, unspecified: Secondary | ICD-10-CM | POA: Diagnosis present

## 2018-08-20 DIAGNOSIS — Z930 Tracheostomy status: Secondary | ICD-10-CM | POA: Diagnosis not present

## 2018-08-20 DIAGNOSIS — M109 Gout, unspecified: Secondary | ICD-10-CM | POA: Diagnosis present

## 2018-08-20 DIAGNOSIS — K567 Ileus, unspecified: Secondary | ICD-10-CM

## 2018-08-20 DIAGNOSIS — A4151 Sepsis due to Escherichia coli [E. coli]: Secondary | ICD-10-CM | POA: Diagnosis not present

## 2018-08-20 DIAGNOSIS — Z7982 Long term (current) use of aspirin: Secondary | ICD-10-CM | POA: Diagnosis not present

## 2018-08-20 DIAGNOSIS — N289 Disorder of kidney and ureter, unspecified: Secondary | ICD-10-CM

## 2018-08-20 DIAGNOSIS — I5030 Unspecified diastolic (congestive) heart failure: Secondary | ICD-10-CM

## 2018-08-20 DIAGNOSIS — T83511D Infection and inflammatory reaction due to indwelling urethral catheter, subsequent encounter: Secondary | ICD-10-CM | POA: Diagnosis not present

## 2018-08-20 DIAGNOSIS — N183 Chronic kidney disease, stage 3 (moderate): Secondary | ICD-10-CM | POA: Diagnosis present

## 2018-08-20 DIAGNOSIS — R6521 Severe sepsis with septic shock: Secondary | ICD-10-CM | POA: Diagnosis present

## 2018-08-20 DIAGNOSIS — D649 Anemia, unspecified: Secondary | ICD-10-CM | POA: Diagnosis present

## 2018-08-20 DIAGNOSIS — I480 Paroxysmal atrial fibrillation: Secondary | ICD-10-CM | POA: Diagnosis present

## 2018-08-20 DIAGNOSIS — N390 Urinary tract infection, site not specified: Secondary | ICD-10-CM | POA: Diagnosis present

## 2018-08-20 DIAGNOSIS — A419 Sepsis, unspecified organism: Secondary | ICD-10-CM

## 2018-08-20 DIAGNOSIS — Z7951 Long term (current) use of inhaled steroids: Secondary | ICD-10-CM | POA: Diagnosis not present

## 2018-08-20 DIAGNOSIS — J9611 Chronic respiratory failure with hypoxia: Secondary | ICD-10-CM

## 2018-08-20 DIAGNOSIS — R6520 Severe sepsis without septic shock: Secondary | ICD-10-CM | POA: Diagnosis not present

## 2018-08-20 DIAGNOSIS — T83511A Infection and inflammatory reaction due to indwelling urethral catheter, initial encounter: Principal | ICD-10-CM

## 2018-08-20 DIAGNOSIS — Y846 Urinary catheterization as the cause of abnormal reaction of the patient, or of later complication, without mention of misadventure at the time of the procedure: Secondary | ICD-10-CM | POA: Diagnosis not present

## 2018-08-20 DIAGNOSIS — B9620 Unspecified Escherichia coli [E. coli] as the cause of diseases classified elsewhere: Secondary | ICD-10-CM | POA: Diagnosis present

## 2018-08-20 DIAGNOSIS — K219 Gastro-esophageal reflux disease without esophagitis: Secondary | ICD-10-CM | POA: Diagnosis present

## 2018-08-20 DIAGNOSIS — E876 Hypokalemia: Secondary | ICD-10-CM | POA: Diagnosis present

## 2018-08-20 DIAGNOSIS — Z79890 Hormone replacement therapy (postmenopausal): Secondary | ICD-10-CM | POA: Diagnosis not present

## 2018-08-20 DIAGNOSIS — Z9911 Dependence on respirator [ventilator] status: Secondary | ICD-10-CM | POA: Diagnosis not present

## 2018-08-20 DIAGNOSIS — E860 Dehydration: Secondary | ICD-10-CM | POA: Diagnosis present

## 2018-08-20 DIAGNOSIS — Z6841 Body Mass Index (BMI) 40.0 and over, adult: Secondary | ICD-10-CM | POA: Diagnosis not present

## 2018-08-20 LAB — CBC
HEMATOCRIT: 30.7 % — AB (ref 39.0–52.0)
HEMOGLOBIN: 9.5 g/dL — AB (ref 13.0–17.0)
MCH: 27.4 pg (ref 26.0–34.0)
MCHC: 30.9 g/dL (ref 30.0–36.0)
MCV: 88.5 fL (ref 78.0–100.0)
Platelets: 283 10*3/uL (ref 150–400)
RBC: 3.47 MIL/uL — ABNORMAL LOW (ref 4.22–5.81)
RDW: 16.9 % — ABNORMAL HIGH (ref 11.5–15.5)
WBC: 23.4 10*3/uL — ABNORMAL HIGH (ref 4.0–10.5)

## 2018-08-20 LAB — CORTISOL: Cortisol, Plasma: 11 ug/dL

## 2018-08-20 LAB — BASIC METABOLIC PANEL
Anion gap: 9 (ref 5–15)
BUN: 18 mg/dL (ref 8–23)
CALCIUM: 8.4 mg/dL — AB (ref 8.9–10.3)
CO2: 23 mmol/L (ref 22–32)
CREATININE: 1.61 mg/dL — AB (ref 0.61–1.24)
Chloride: 110 mmol/L (ref 98–111)
GFR, EST AFRICAN AMERICAN: 49 mL/min — AB (ref >60)
GFR, EST NON AFRICAN AMERICAN: 43 mL/min — AB (ref >60)
Glucose, Bld: 84 mg/dL (ref 70–99)
Potassium: 3.5 mmol/L (ref 3.5–5.1)
SODIUM: 142 mmol/L (ref 135–145)

## 2018-08-20 LAB — COMPREHENSIVE METABOLIC PANEL
ALBUMIN: 2.6 g/dL — AB (ref 3.5–5.0)
ALT: 26 U/L (ref 0–44)
ANION GAP: 11 (ref 5–15)
AST: 25 U/L (ref 15–41)
Alkaline Phosphatase: 175 U/L — ABNORMAL HIGH (ref 38–126)
BUN: 17 mg/dL (ref 8–23)
CO2: 24 mmol/L (ref 22–32)
Calcium: 8.3 mg/dL — ABNORMAL LOW (ref 8.9–10.3)
Chloride: 107 mmol/L (ref 98–111)
Creatinine, Ser: 1.73 mg/dL — ABNORMAL HIGH (ref 0.61–1.24)
GFR calc Af Amer: 45 mL/min — ABNORMAL LOW (ref >60)
GFR calc non Af Amer: 39 mL/min — ABNORMAL LOW (ref >60)
GLUCOSE: 101 mg/dL — AB (ref 70–99)
POTASSIUM: 3.4 mmol/L — AB (ref 3.5–5.1)
SODIUM: 142 mmol/L (ref 135–145)
Total Bilirubin: 1 mg/dL (ref 0.3–1.2)
Total Protein: 6.5 g/dL (ref 6.5–8.1)

## 2018-08-20 LAB — BLOOD CULTURE ID PANEL (REFLEXED)
Acinetobacter baumannii: NOT DETECTED
CANDIDA ALBICANS: NOT DETECTED
Candida glabrata: NOT DETECTED
Candida krusei: NOT DETECTED
Candida parapsilosis: NOT DETECTED
Candida tropicalis: NOT DETECTED
ENTEROBACTERIACEAE SPECIES: NOT DETECTED
ENTEROCOCCUS SPECIES: NOT DETECTED
Enterobacter cloacae complex: NOT DETECTED
Escherichia coli: NOT DETECTED
HAEMOPHILUS INFLUENZAE: NOT DETECTED
Klebsiella oxytoca: NOT DETECTED
Klebsiella pneumoniae: NOT DETECTED
LISTERIA MONOCYTOGENES: NOT DETECTED
NEISSERIA MENINGITIDIS: NOT DETECTED
PSEUDOMONAS AERUGINOSA: NOT DETECTED
Proteus species: NOT DETECTED
SERRATIA MARCESCENS: NOT DETECTED
STAPHYLOCOCCUS AUREUS BCID: NOT DETECTED
STAPHYLOCOCCUS SPECIES: NOT DETECTED
STREPTOCOCCUS PYOGENES: NOT DETECTED
Streptococcus agalactiae: NOT DETECTED
Streptococcus pneumoniae: NOT DETECTED
Streptococcus species: NOT DETECTED

## 2018-08-20 LAB — PROCALCITONIN: PROCALCITONIN: 61.68 ng/mL

## 2018-08-20 LAB — ECHOCARDIOGRAM COMPLETE
Height: 65 in
WEIGHTICAEL: 5224.02 [oz_av]

## 2018-08-20 LAB — TROPONIN I
TROPONIN I: 0.05 ng/mL — AB (ref <0.03)
Troponin I: 0.04 ng/mL (ref <0.03)
Troponin I: 0.03 ng/mL (ref <0.03)

## 2018-08-20 LAB — MAGNESIUM: MAGNESIUM: 1.8 mg/dL (ref 1.7–2.4)

## 2018-08-20 LAB — CREATININE, SERUM
Creatinine, Ser: 1.74 mg/dL — ABNORMAL HIGH (ref 0.61–1.24)
GFR calc Af Amer: 45 mL/min — ABNORMAL LOW (ref >60)
GFR, EST NON AFRICAN AMERICAN: 39 mL/min — AB (ref >60)

## 2018-08-20 LAB — MRSA PCR SCREENING: MRSA by PCR: NEGATIVE

## 2018-08-20 LAB — HIV ANTIBODY (ROUTINE TESTING W REFLEX): HIV SCREEN 4TH GENERATION: NONREACTIVE

## 2018-08-20 LAB — GLUCOSE, CAPILLARY: Glucose-Capillary: 89 mg/dL (ref 70–99)

## 2018-08-20 MED ORDER — PHENOL 1.4 % MT LIQD
1.0000 | OROMUCOSAL | Status: DC | PRN
  Filled 2018-08-20: qty 177

## 2018-08-20 MED ORDER — ACETAMINOPHEN 650 MG RE SUPP: 650.0000 mg | Freq: Four times a day (QID) | RECTAL | Status: DC | PRN

## 2018-08-20 MED ORDER — AMIODARONE HCL 200 MG PO TABS
200.0000 mg | ORAL_TABLET | Freq: Every day | ORAL | Status: DC
  Administered 2018-08-20 – 2018-08-26 (×7): 200 mg
  Filled 2018-08-20 (×7): qty 1

## 2018-08-20 MED ORDER — FLUTICASONE PROPIONATE 50 MCG/ACT NA SUSP
2.0000 | Freq: Every day | NASAL | Status: DC
  Administered 2018-08-23 – 2018-08-26 (×4): 2 via NASAL
  Filled 2018-08-20: qty 16

## 2018-08-20 MED ORDER — DIPHENHYDRAMINE HCL 25 MG PO CAPS: 25.0000 mg | ORAL_CAPSULE | Freq: Four times a day (QID) | ORAL | Status: DC | PRN

## 2018-08-20 MED ORDER — OSMOLITE 1.5 CAL PO LIQD
1000.0000 mL | ORAL | Status: DC
  Filled 2018-08-20: qty 1000

## 2018-08-20 MED ORDER — BISACODYL 10 MG RE SUPP
10.0000 mg | Freq: Every day | RECTAL | Status: DC | PRN
  Administered 2018-08-20 – 2018-08-25 (×2): 10 mg via RECTAL
  Filled 2018-08-20 (×3): qty 1

## 2018-08-20 MED ORDER — OSMOLITE 1.5 CAL PO LIQD
1000.0000 mL | ORAL | Status: DC
  Administered 2018-08-21 – 2018-08-23 (×3): 1000 mL
  Filled 2018-08-20 (×3): qty 1000

## 2018-08-20 MED ORDER — MELATONIN 3 MG PO TABS
3.0000 mg | ORAL_TABLET | Freq: Every day | ORAL | Status: DC
  Administered 2018-08-20 – 2018-08-25 (×6): 3 mg
  Filled 2018-08-20 (×7): qty 1

## 2018-08-20 MED ORDER — MAGNESIUM HYDROXIDE 400 MG/5ML PO SUSP: 30.0000 mL | Freq: Every day | ORAL | Status: DC | PRN

## 2018-08-20 MED ORDER — SODIUM CHLORIDE 0.9 % IV SOLN
INTRAVENOUS | Status: DC
  Administered 2018-08-20 (×3): via INTRAVENOUS

## 2018-08-20 MED ORDER — ACETAMINOPHEN 325 MG PO TABS: 650.0000 mg | ORAL_TABLET | Freq: Four times a day (QID) | ORAL | Status: DC | PRN

## 2018-08-20 MED ORDER — POTASSIUM CHLORIDE 20 MEQ/15ML (10%) PO SOLN
20.0000 meq | Freq: Every day | ORAL | Status: DC
  Administered 2018-08-20 – 2018-08-21 (×2): 20 meq via ORAL
  Filled 2018-08-20 (×2): qty 15

## 2018-08-20 MED ORDER — POLYETHYLENE GLYCOL 3350 17 G PO PACK: 17.0000 g | PACK | Freq: Every day | ORAL | Status: DC | PRN

## 2018-08-20 MED ORDER — HEPARIN SODIUM (PORCINE) 5000 UNIT/ML IJ SOLN
5000.0000 [IU] | Freq: Three times a day (TID) | INTRAMUSCULAR | Status: DC
  Administered 2018-08-20 – 2018-08-26 (×19): 5000 [IU] via SUBCUTANEOUS
  Filled 2018-08-20 (×19): qty 1

## 2018-08-20 MED ORDER — ORAL CARE MOUTH RINSE
15.0000 mL | OROMUCOSAL | Status: DC
  Administered 2018-08-20 – 2018-08-23 (×30): 15 mL via OROMUCOSAL

## 2018-08-20 MED ORDER — ALLOPURINOL 100 MG PO TABS
100.0000 mg | ORAL_TABLET | Freq: Every evening | ORAL | Status: DC
  Administered 2018-08-20 – 2018-08-25 (×6): 100 mg
  Filled 2018-08-20 (×8): qty 1

## 2018-08-20 MED ORDER — POTASSIUM CHLORIDE CRYS ER 20 MEQ PO TBCR
20.0000 meq | EXTENDED_RELEASE_TABLET | Freq: Every day | ORAL | Status: DC
  Administered 2018-08-20: 20 meq via ORAL
  Filled 2018-08-20: qty 1

## 2018-08-20 MED ORDER — POTASSIUM CHLORIDE 20 MEQ/15ML (10%) PO SOLN
20.0000 meq | Freq: Once | ORAL | Status: AC
  Administered 2018-08-20: 20 meq
  Filled 2018-08-20: qty 15

## 2018-08-20 MED ORDER — METOPROLOL TARTRATE 12.5 MG HALF TABLET
25.0000 mg | ORAL_TABLET | Freq: Two times a day (BID) | ORAL | Status: DC
  Administered 2018-08-20 – 2018-08-26 (×6): 25 mg
  Filled 2018-08-20: qty 1
  Filled 2018-08-20: qty 2
  Filled 2018-08-20: qty 1
  Filled 2018-08-20 (×4): qty 2
  Filled 2018-08-20: qty 1
  Filled 2018-08-20: qty 2
  Filled 2018-08-20 (×4): qty 1

## 2018-08-20 MED ORDER — ALBUTEROL SULFATE (2.5 MG/3ML) 0.083% IN NEBU: 2.5000 mg | INHALATION_SOLUTION | RESPIRATORY_TRACT | Status: DC | PRN

## 2018-08-20 MED ORDER — IPRATROPIUM-ALBUTEROL 0.5-2.5 (3) MG/3ML IN SOLN
3.0000 mL | Freq: Four times a day (QID) | RESPIRATORY_TRACT | Status: DC
  Administered 2018-08-20 – 2018-08-26 (×26): 3 mL via RESPIRATORY_TRACT
  Filled 2018-08-20 (×26): qty 3

## 2018-08-20 MED ORDER — DOCUSATE SODIUM 50 MG/5ML PO LIQD
100.0000 mg | Freq: Two times a day (BID) | ORAL | Status: DC
  Administered 2018-08-20 – 2018-08-26 (×13): 100 mg
  Filled 2018-08-20 (×13): qty 10

## 2018-08-20 MED ORDER — PANTOPRAZOLE SODIUM 40 MG PO PACK
40.0000 mg | PACK | Freq: Every day | ORAL | Status: DC
  Administered 2018-08-20 – 2018-08-26 (×7): 40 mg
  Filled 2018-08-20 (×7): qty 20

## 2018-08-20 MED ORDER — ASPIRIN 81 MG PO CHEW
81.0000 mg | CHEWABLE_TABLET | Freq: Every day | ORAL | Status: DC
  Administered 2018-08-20 – 2018-08-26 (×7): 81 mg via ORAL
  Filled 2018-08-20 (×7): qty 1

## 2018-08-20 MED ORDER — MINERAL OIL RE ENEM
1.0000 | ENEMA | Freq: Every day | RECTAL | Status: DC | PRN
  Filled 2018-08-20: qty 1

## 2018-08-20 MED ORDER — CHLORHEXIDINE GLUCONATE 0.12% ORAL RINSE (MEDLINE KIT)
15.0000 mL | Freq: Two times a day (BID) | OROMUCOSAL | Status: DC
  Administered 2018-08-20 – 2018-08-26 (×13): 15 mL via OROMUCOSAL

## 2018-08-20 MED ORDER — GUAIFENESIN 100 MG/5ML PO SOLN
200.0000 mg | ORAL | Status: DC | PRN
  Filled 2018-08-20: qty 10

## 2018-08-20 MED ORDER — ONDANSETRON HCL 4 MG PO TABS: 4.0000 mg | ORAL_TABLET | Freq: Four times a day (QID) | ORAL | Status: DC | PRN

## 2018-08-20 MED ORDER — SENNOSIDES 8.8 MG/5ML PO SYRP
5.0000 mL | ORAL_SOLUTION | Freq: Every day | ORAL | Status: DC
  Administered 2018-08-20 – 2018-08-25 (×6): 5 mL
  Filled 2018-08-20 (×6): qty 5

## 2018-08-20 MED ORDER — ACETAMINOPHEN 160 MG/5ML PO SOLN
650.0000 mg | Freq: Four times a day (QID) | ORAL | Status: DC | PRN
  Administered 2018-08-20 – 2018-08-24 (×3): 650 mg
  Filled 2018-08-20 (×3): qty 20.3

## 2018-08-20 MED ORDER — PERFLUTREN LIPID MICROSPHERE
INTRAVENOUS | Status: AC
  Administered 2018-08-20: 3 mL
  Filled 2018-08-20: qty 10

## 2018-08-20 MED ORDER — LORAZEPAM 1 MG PO TABS
1.0000 mg | ORAL_TABLET | Freq: Four times a day (QID) | ORAL | Status: DC | PRN
  Administered 2018-08-25 (×2): 1 mg
  Filled 2018-08-20 (×2): qty 1

## 2018-08-20 MED ORDER — FAMOTIDINE IN NACL 20-0.9 MG/50ML-% IV SOLN
20.0000 mg | INTRAVENOUS | Status: DC
  Filled 2018-08-20: qty 50

## 2018-08-20 NOTE — Plan of Care (Signed)
  Problem: Health Behavior/Discharge Planning: Goal: Ability to manage health-related needs will improve Outcome: Progressing   Problem: Clinical Measurements: Goal: Will remain free from infection Outcome: Progressing Goal: Diagnostic test results will improve Outcome: Progressing Goal: Respiratory complications will improve Outcome: Progressing Goal: Cardiovascular complication will be avoided Outcome: Progressing   Problem: Activity: Goal: Risk for activity intolerance will decrease Outcome: Progressing   Problem: Nutrition: Goal: Adequate nutrition will be maintained Outcome: Progressing   Problem: Coping: Goal: Level of anxiety will decrease Outcome: Progressing   

## 2018-08-20 NOTE — Progress Notes (Signed)
Initial Nutrition Assessment  DOCUMENTATION CODES:   Morbid obesity  INTERVENTION:   Continue trickle tube feeds via NG tube: - Osmolite 1.5 @ 20 ml/hr (480 ml/day)  Once tolerance established, recommend initiating tube feeding of Vital High Protein @ 45 ml/hr and 60 ml Pro-stat BID.  Recommended tube feeding regimen provides 1480 kcal, 155 grams of protein, and 1243 ml of H2O.   NUTRITION DIAGNOSIS:   Inadequate oral intake related to inability to eat as evidenced by NPO status.  GOAL:   Provide needs based on ASPEN/SCCM guidelines  MONITOR:   Vent status, Labs, Weight trends, Skin, I & O's  REASON FOR ASSESSMENT:   Ventilator    ASSESSMENT:   67 year old male who presented to the ED from [facility] with new onset fever. PMH significant for chronic respiratory failure requiring chronic trach on vent, chronic ileus, COPD, GERD, CKD stage 3, hypertension, and atrial fibrillation. Pt receives feedings by and NG tube. Per [facility], a G-tube was not placed due to pt's ileus. Pt admitted with sepsis due to UTI.  Discussed pt with RN and critical care NP. RN unsure of pt's tube feeding formula PTA. Per NP, will initiate trickle feeds of Osmolite 1.5 @ 20 ml/hr. RD to leave recommendations for tube feeding regimen that will meet 100% of pt's estimated energy and protein needs.  Noted pt with significant recent weight loss of 33.4 lbs in less than 2 months. This is a 9.3% weight loss which is significant for timeframe. RD to follow weight trends closely during admission.  Patient is currently on vent support via trach. NG tube to low intermittent suction. MV: 13.9 L/min Temp (24hrs), Avg:99.9 F (37.7 C), Min:99.1 F (37.3 C), Max:102.1 F (38.9 C) BP: 107/61 MAP: 76  Medications reviewed and include: 100 mg Colace BID, 40 mg Protonix daily, 20 mEq K-dur daily, Senokot daily, Osmolite 1.5 (currently held as NGT is to suction), IV Cefepime  IVF: NaCl @ 100 ml/hr  Labs  reviewed: potassium 3.4 (L), creatinine 1.74 (H), hemoglobin 9.5 (L), HCT 30.7 (L)  I/O's: +5.8 L since admission  NUTRITION - FOCUSED PHYSICAL EXAM:    Most Recent Value  Orbital Region  No depletion  Upper Arm Region  No depletion  Thoracic and Lumbar Region  No depletion  Buccal Region  No depletion  Temple Region  No depletion  Clavicle Bone Region  No depletion  Clavicle and Acromion Bone Region  Mild depletion  Scapular Bone Region  Unable to assess  Dorsal Hand  No depletion  Patellar Region  No depletion  Anterior Thigh Region  Mild depletion  Edema (RD Assessment)  Severe [abdomen]  Hair  Reviewed  Eyes  Reviewed  Mouth  Reviewed [poor dentition]  Skin  Reviewed  Nails  Reviewed       Diet Order:   Diet Order            Diet NPO time specified  Diet effective now              EDUCATION NEEDS:   No education needs have been identified at this time  Skin:  Skin Assessment: Reviewed RN Assessment (skin tear to buttocks)  Last BM:  PTA  Height:   Ht Readings from Last 1 Encounters:  08/20/18 5\' 5"  (1.651 m)    Weight:   Wt Readings from Last 1 Encounters:  08/20/18 (!) 148.1 kg    Ideal Body Weight:  61.82 kg  BMI:  Body mass index is 54.33  kg/m.  Estimated Nutritional Needs:   Kcal:  2979-8921  Protein:  >/ 155 grams  Fluid:  >/= 1.6 L    Earma Reading, MS, RD, LDN Pager: [phone] Weekend/After Hours: [phone]

## 2018-08-20 NOTE — Progress Notes (Signed)
eLink Physician-Brief Progress Note Patient Name: Alex Reilly DOB: October 24, 1951 MRN: [id number]   Date of Service  08/20/2018  HPI/Events of Note  K+ 3.4  eICU Interventions  KCL 20 meq via NG tube x 1 Check serum Mg level this AM BMET at noon        [employer] 08/20/2018, 7:05 AM

## 2018-08-20 NOTE — Progress Notes (Signed)
  Echocardiogram 2D Echocardiogram has been performed.  Allisson Schindel T Greenlee Ancheta 08/20/2018, 2:31 PM

## 2018-08-20 NOTE — Progress Notes (Signed)
RT transported pt from ED to 3M13 on vent without any complications. Pt resting comfortably, receiving RT Will notified and given report prior to transport.

## 2018-08-20 NOTE — Progress Notes (Signed)
PHARMACY - PHYSICIAN COMMUNICATION CRITICAL VALUE ALERT - BLOOD CULTURE IDENTIFICATION (BCID)  Alex Reilly is an 67 y.o. male who presented to [facility] on 08/19/2018 with a chief complaint of sepsis  Assessment:  67 yo male admitted from [facility] with respiratory failure, chronic foley.  1 of 4 blood cultures positive for GPC (anaerobic bottle). BCID did not detect anything, indicating likely contaminant.  Name of physician (or Provider) Contacted: [place]  Current antibiotics: Cefepime, Vancomycin  Changes to prescribed antibiotics recommended:  Patient is on recommended antibiotics - No changes needed  Results for orders placed or performed during the hospital encounter of 08/19/18  Blood Culture ID Panel (Reflexed) (Collected: 08/19/2018  7:13 PM)  Result Value Ref Range   Enterococcus species NOT DETECTED NOT DETECTED   Listeria monocytogenes NOT DETECTED NOT DETECTED   Staphylococcus species NOT DETECTED NOT DETECTED   Staphylococcus aureus NOT DETECTED NOT DETECTED   Streptococcus species NOT DETECTED NOT DETECTED   Streptococcus agalactiae NOT DETECTED NOT DETECTED   Streptococcus pneumoniae NOT DETECTED NOT DETECTED   Streptococcus pyogenes NOT DETECTED NOT DETECTED   Acinetobacter baumannii NOT DETECTED NOT DETECTED   Enterobacteriaceae species NOT DETECTED NOT DETECTED   Enterobacter cloacae complex NOT DETECTED NOT DETECTED   Escherichia coli NOT DETECTED NOT DETECTED   Klebsiella oxytoca NOT DETECTED NOT DETECTED   Klebsiella pneumoniae NOT DETECTED NOT DETECTED   Proteus species NOT DETECTED NOT DETECTED   Serratia marcescens NOT DETECTED NOT DETECTED   Haemophilus influenzae NOT DETECTED NOT DETECTED   Neisseria meningitidis NOT DETECTED NOT DETECTED   Pseudomonas aeruginosa NOT DETECTED NOT DETECTED   Candida albicans NOT DETECTED NOT DETECTED   Candida glabrata NOT DETECTED NOT DETECTED   Candida krusei NOT DETECTED NOT DETECTED   Candida parapsilosis NOT  DETECTED NOT DETECTED   Candida tropicalis NOT DETECTED NOT DETECTED    Jenetta Downer, BCCP Clinical Pharmacist Phone [phone]  08/20/2018 8:22 PM

## 2018-08-20 NOTE — Progress Notes (Signed)
PROGRESS NOTE   Alex Reilly  CZY:[id number]    DOB: May 19, 1951    DOA: 08/19/2018  PCP: [name], MD   I have briefly reviewed patients previous medical records in [facility].  Brief Narrative:  67 year old male transferred to [facility] from [facility] [place] significant for chronic hypoxic respiratory failure, status post tracheostomy and ventilator dependent, COPD, PAF, CKD stage III, HTN, chronic ileus with chronic NG tube feeding, chronic indwelling Foley catheter presented with fever and hypotension.  Admitted to stepdown unit for sepsis secondary to CAUTI.  Foley catheter was not in the appropriate position, was changed.  CCM consulted for vent management.   Assessment & Plan:   Principal Problem:   Sepsis ([place]) Active Problems:   Urinary tract infection associated with indwelling urethral catheter (HCC)   Anemia   Renal insufficiency   COPD (chronic obstructive pulmonary disease) (HCC)   Chronic respiratory failure with hypoxia (HCC)   Tracheostomy status ([place])   Sepsis due to suspected CAUTI: Patient met sepsis criteria on admission.  Blood cultures x2: Negative to date.  Urine culture pending.  MRSA PCR negative.  CT abdomen shows chronic ileus but no other acute abnormalities.  Chest x-ray shows chronic diaphragm elevation, low inspiratory volumes and atelectasis.  Treated per sepsis protocol with IV fluids and IV vancomycin and cefepime.  Improving.  Urinary retention/chronic indwelling Foley catheter: Foley catheter tip and balloon were within the urethra.  Foley catheter was changed.  Acute on chronic kidney disease: Creatinine not exactly known.  Had creatinine of 1.95 in July 2019.  Initial creatinine this admission was 1.58 which increased to 1.73.  Likely related to sepsis, dehydration and poorly functioning Foley catheter.  Improved to 1.61.  Follow BMP daily  Hypokalemia: Replaced.  Follow.  BMP 1.8.  Anemia: Possibly chronic disease.  No bleeding  reported.  Follow CBC daily.  Leukocytosis: Secondary to sepsis.  Follow CBCs.  Elevated troponin: No chest pain reported.  Flat trend.  Likely demand from sepsis.  Chronic ileus: Reported reason for not having G-tube.  Abdomen markedly distended.  Currently on NG tube with trickle feeds.?  Explore option of placing G-tube by general surgery this admission.  Chronic hypoxic respiratory failure/tracheostomy and vent dependent: Management per CCM, input appreciated.  Paroxysmal A. fib: Currently in sinus rhythm.  Continue amiodarone, metoprolol as blood pressure tolerates and aspirin.  Not on anticoagulation.  COPD: Stable without clinical bronchospasm.  Gout: Without acute flare.   DVT prophylaxis: SCDs Code Status: Full Family Communication: None at bedside Disposition: To be determined.   Consultants:  CCM  Procedures:  Patient has tracheostomy and chronic vent. Foley catheter changed this admission. NG tube.  Antimicrobials:  Vancomycin and cefepime.   Subjective: Able to mouth and not head to questions.  States that he feels "okay".  Denies pain.  Indicates that his abdominal distention of current size is chronic and unchanged.  ROS: As above, otherwise unable or negative.  Objective:  Vitals:   08/20/18 1552 08/20/18 1600 08/20/18 1700 08/20/18 1800  BP:  (!) 122/59 117/90 (!) 119/104  Pulse:  65 83 62  Resp:  (!) 23 (!) 21 (!) 23  Temp:      TempSrc:      SpO2: 100% 100% 100% 100%  Weight:      Height:        Examination:  General exam: Pleasant middle-aged male, moderately built, morbidly obese, lying comfortably supine in bed. Respiratory system: Diminished breath sounds  in the bases but otherwise clear to auscultation. Respiratory effort normal. Cardiovascular system: S1 & S2 heard, RRR. No JVD, murmurs, rubs, gallops or clicks.  Trace bilateral ankle edema.  Telemetry personally reviewed: Sinus rhythm Gastrointestinal system: Abdomen is hugely  distended, soft, nontender.  Lower midline surgical scar.  Upper midline ventral hernia.  Organomegaly or masses difficult to appreciate.  Diminished/tingling bowel sounds heard. Central nervous system: Alert and oriented. No focal neurological deficits.  Follows instructions appropriately Extremities: Symmetric 5 x 5 power. Skin: No rashes, lesions or ulcers Psychiatry: Judgement and insight appear normal. Mood & affect appropriate.     Data Reviewed: I have personally reviewed following labs and imaging studies  CBC: Recent Labs  Lab 08/19/18 1907 08/20/18 0237  WBC 12.9* 23.4*  NEUTROABS 12.2*  --   HGB 11.0* 9.5*  HCT 35.5* 30.7*  MCV 88.1 88.5  PLT 298 650   Basic Metabolic Panel: Recent Labs  Lab 08/19/18 1907 08/20/18 0237 08/20/18 0748 08/20/18 1216  NA 141 142  --  142  K 4.1 3.4*  --  3.5  CL 105 107  --  110  CO2 25 24  --  23  GLUCOSE 102* 101*  --  84  BUN 19 17  --  18  CREATININE 1.58* 1.74*  1.73*  --  1.61*  CALCIUM 8.8* 8.3*  --  8.4*  MG  --   --  1.8  --    Liver Function Tests: Recent Labs  Lab 08/19/18 1907 08/20/18 0237  AST 29 25  ALT 29 26  ALKPHOS 213* 175*  BILITOT 0.9 1.0  PROT 7.1 6.5  ALBUMIN 2.9* 2.6*   Coagulation Profile: Recent Labs  Lab 08/19/18 1907  INR 1.08   Cardiac Enzymes: Recent Labs  Lab 08/20/18 0237 08/20/18 0748 08/20/18 1424  TROPONINI 0.05* 0.04* 0.03*   HbA1C: No results for input(s): HGBA1C in the last 72 hours. CBG: Recent Labs  Lab 08/20/18 0329  GLUCAP 89    Recent Results (from the past 240 hour(s))  Culture, blood (Routine x 2)     Status: None (Preliminary result)   Collection Time: 08/19/18  7:07 PM  Result Value Ref Range Status   Specimen Description SITE NOT SPECIFIED  Final   Special Requests   Final    BOTTLES DRAWN AEROBIC AND ANAEROBIC Blood Culture adequate volume   Culture   Final    NO GROWTH < 24 HOURS Performed at [facility], [address]  [place], [place] [address]    Report Status PENDING  Incomplete  Culture, blood (Routine x 2)     Status: None (Preliminary result)   Collection Time: 08/19/18  7:13 PM  Result Value Ref Range Status   Specimen Description SITE NOT SPECIFIED  Final   Special Requests   Final    BOTTLES DRAWN AEROBIC AND ANAEROBIC Blood Culture adequate volume   Culture  Setup Time   Final    GRAM POSITIVE COCCI ANAEROBIC BOTTLE ONLY Organism ID to follow    Culture   Final    NO GROWTH < 24 HOURS Performed at [facility], [place] [address], [place], [place] [address]    Report Status PENDING  Incomplete  MRSA PCR Screening     Status: None   Collection Time: 08/20/18  1:55 AM  Result Value Ref Range Status   MRSA by PCR NEGATIVE NEGATIVE Final    Comment:  The GeneXpert MRSA Assay (FDA approved for NASAL specimens only), is one component of a comprehensive MRSA colonization surveillance program. It is not intended to diagnose MRSA infection nor to guide or monitor treatment for MRSA infections. Performed at [facility], [place] [address] [place], [place] [address]          Radiology Studies: Ct Abdomen Pelvis W Contrast  Addendum Date: 08/19/2018   ADDENDUM REPORT: 08/19/2018 21:25 ADDENDUM: The IMPRESSION should also include: Foley catheter tip and balloon within the urethra. Recommend advancement into the bladder after balloon deflation. Electronically Signed   By: Margarette Canada M.D.   On: 08/19/2018 21:25   Result Date: 08/19/2018 CLINICAL DATA:  68 year old male with acute abdominal pain with fever. EXAM: CT ABDOMEN AND PELVIS WITH CONTRAST TECHNIQUE: Multidetector CT imaging of the abdomen and pelvis was performed using the standard protocol following bolus administration of intravenous contrast. CONTRAST:  167m ISOVUE-300 IOPAMIDOL (ISOVUE-300) INJECTION 61% COMPARISON:  07/08/2018 and prior CTs FINDINGS: Lower chest: Cardiomegaly, coronary artery disease and bibasilar  atelectasis again noted. Hepatobiliary: No significant hepatic or gallbladder abnormalities. No biliary dilatation. Pancreas: Unremarkable Spleen: Unremarkable Adrenals/Urinary Tract: The kidneys, adrenal glands and bladder are unremarkable. A Foley catheter is noted with tip and inflated balloon in the urethra. Stomach/Bowel: NG tube within the stomach is noted. Again noted is marked dilatation of the transverse colon and filled mainly with gas. Fluid within portions of the ascending and transverse colon are noted. The transverse colon measures up to 16 cm in greatest diameter. No small bowel dilatation identified. No definite bowel wall thickening or inflammatory changes noted. Vascular/Lymphatic: Aortic atherosclerosis. An IVC filter is again noted. No enlarged abdominal or pelvic lymph nodes. Reproductive: No significant prostate abnormalities. Other: No free fluid, abscess or pneumoperitoneum. Musculoskeletal: No acute or suspicious bony abnormalities. IMPRESSION: 1. Unchanged marked dilatation of the colon again noted (primarily transverse) containing gas and some fluid without obstructing cause. This is unchanged from 05/06/2018 likely representing a chronic ileus. No evidence of small bowel obstruction, abscess or pneumoperitoneum. 2. No acute abnormalities identified. 3. Cardiomegaly, coronary artery disease and unchanged mild bibasilar atelectasis. 4.  Aortic Atherosclerosis (ICD10-I70.0). Electronically Signed: By: JMargarette CanadaM.D. On: 08/19/2018 21:17   Dg Chest Portable 1 View  Result Date: 08/19/2018 CLINICAL DATA:  67year old male with new onset fever EXAM: PORTABLE CHEST 1 VIEW COMPARISON:  Prior chest x-ray 07/08/2018 FINDINGS: Stable cardiomegaly. Mediastinal contours are unchanged. The patient is intubated. The endotracheal tube tip is 5.3 cm from the carina. A gastric tube is present. The tip lies below the diaphragm, presumably in the stomach. Similar appearance of the lungs with low  inspiratory volumes and chronic elevation of the right hemidiaphragm. Nonspecific bibasilar opacities may reflect atelectasis or infiltrate but appears similar compared to prior. Mild vascular congestion, no overt edema. IMPRESSION: 1. The tip of the endotracheal tube is 5.3 cm above the carina. 2. The tip of the gastric tube lies inferior to the diaphragm, presumably within the stomach 3. Stable appearance of the chest with chronic elevation of the right hemidiaphragm, overall low inspiratory volumes and probable bibasilar atelectasis. Electronically Signed   By: HJacqulynn CadetM.D.   On: 08/19/2018 19:34        Scheduled Meds: . allopurinol  100 mg Per Tube QPM  . amiodarone  200 mg Per Tube Daily  . aspirin  81 mg Oral Daily  . chlorhexidine gluconate (MEDLINE KIT)  15 mL Mouth Rinse BID  . docusate  100 mg Per Tube BID  . [START ON 08/21/2018] feeding supplement (OSMOLITE 1.5 CAL)  1,000 mL Per Tube Q24H  . fluticasone  2 spray Each Nare Daily  . heparin  5,000 Units Subcutaneous Q8H  . ipratropium-albuterol  3 mL Nebulization Q6H  . mouth rinse  15 mL Mouth Rinse 10 times per day  . Melatonin  3 mg Per Tube QHS  . metoprolol tartrate  25 mg Per Tube BID  . pantoprazole sodium  40 mg Per Tube Daily  . potassium chloride  20 mEq Oral Daily  . sennosides  5 mL Per Tube QHS   Continuous Infusions: . sodium chloride 100 mL/hr at 08/20/18 1222  . ceFEPime (MAXIPIME) IV 2 g (08/20/18 1029)  . vancomycin       LOS: 0 days     Vernell Leep, MD, FACP, [facility]. [facility] Pager [phone]  If 7PM-7AM, please contact night-coverage [email or web address] Password [facility] 08/20/2018, 6:33 PM

## 2018-08-20 NOTE — Progress Notes (Signed)
Alex Reilly  TMA:[id number] DOB: October 14, 1951 DOA: 08/19/2018 PCP: Sherron Monday, MD    LOS: 0 days   Reason for Consult / Chief Complaint:  Chronic Vent Management/ fever and hypotension  Consulting MD and date of consult:  Dr. Fredderick Phenix 9/5  HPI/summary of hospital stay:  HPI obtained from medical chart review and partially from patient, although is limited as patient is non-verbal with trach but attempts to mouth words.   67 year old male from [facility] with PMH significant for chronic respiratory failure, s/p trach and ventilator dependence, COPD, PAF, CKD, HTN, chronic ileus with chronic NGT tube, and chronic indwelling foley catheter sent for fever and hypotension.  Some reports of NGT backing up and vomiting but patient denies any acute abdominal pain or nausea.  Unclear why patient has chronic NGT.  He attempts to relay that he has had some speech therapy with PMV and hopes to avoid Gtube, despite chronic ileus.   CXR without evidence of acute event.  ABG normal on current ventilator settings.  He was given 30 ml/kg bolus with resolution of hypotension and empirically started on vancomycin, cefepime, and flagyl.  Workup showing UTI.  Normal lactate.  He has remained alert and able to communicate at his baseline and hemodynamically stable, therefore will be admitted by [facility], and PCCM will consult for chronic ventilator management.   Subjective  Patient has no complaints.  Assessment & Plan:  Chronic respiratory failure, chronic trach and MV dependence Aspiration risk COPD - no current evidence of acute respiratory issue P:  Continue [facility] MV settings, PCV 18/ 5/ 40% Trach care per protocol (unclear last trach change) VAP bundle Intermittent CXR as needed Aspiration precautions given chronic ileus Pepcid for SUP Duonebs q 6 Albuterol prn Pulm hygiene, mobilize with PT as able   Remainder per primary team.  PCCM will continue to follow for ventilator needs.    Best Practice / Goals of Care / Disposition.   DVT prophylaxis: heparin SQ GI prophylaxis: Pepcid  Diet:  NPO Mobility: bedrest, advance as able Code Status: Full  Family Communication: Patient updated on plan of care.  No family present at bedside.   Disposition / Summary of Today's Plan 08/20/18   Admit to SDU per [facility] for sepsis and UTI.  Consultants: date of consult/date signed off (if applicable)/final recs  TR[facility] primary team  Procedures: 9/5 Foley catheter repositioned  Significant Diagnostic Tests: 9/5 CT abd/pelvis >> 1. Unchanged marked dilatation of the colon again noted (primarily transverse) containing gas and some fluid without obstructing cause. This is unchanged from 05/06/2018 likely representing a chronic ileus. No evidence of small bowel obstruction, abscess or pneumoperitoneum. 2. No acute abnormalities identified. 3. Cardiomegaly, coronary artery disease and unchanged mild bibasilar atelectasis. 4.  Aortic Atherosclerosis   Micro Data: 9/6 MRSA PCR >> 9/6 UC >> 9/6 BCx 2 >>  Antimicrobials:  9/5 vancomycin >> 9/5 flagyl >> 9/5 cefepime >>  Objective    Examination: General:  Chronically ill, pleasant morbidly obese AAM currently in NAD HEENT: MM pink/moist, poor dentition, pupils 3/reactive, midline 6 shiley prox XLT cuffed, site wnl, NGT to R nare Neuro: Alert, MAE, f/c, mouth words CV:  rrr, distant, no obvious murmur PULM: even/non-labored on PCV MV, adequate TV's 550- 650, lungs bilaterally clear, no wheeze, minimal secretions GI: marked distention, non-tender, tympanic, +bs Extremities: warm/dry, no edema  Skin: no rashes   Blood pressure (!) 96/57, pulse 68, temperature (!) 102.1 F (38.9 C),  temperature source Oral, resp. rate 16, weight (!) 170 kg, SpO2 100 %.    Vent Mode: PCV FiO2 (%):  [40 %-100 %] 40 % Set Rate:  [18 bmp] 18 bmp PEEP:  [5 cmH20] 5 cmH20 Plateau Pressure:  [18 cmH20-19 cmH20] 18 cmH20   Intake/Output Summary  (Last 24 hours) at 08/20/2018 0115 Last data filed at 08/19/2018 2339 Gross per 24 hour  Intake 5019.36 ml  Output -  Net 5019.36 ml   Filed Weights   08/19/18 1901  Weight: (!) 170 kg     Labs    CBC: Recent Labs  Lab 08/19/18 1907  WBC 12.9*  NEUTROABS 12.2*  HGB 11.0*  HCT 35.5*  MCV 88.1  PLT 298   Basic Metabolic Panel: Recent Labs  Lab 08/19/18 1907  NA 141  K 4.1  CL 105  CO2 25  GLUCOSE 102*  BUN 19  CREATININE 1.58*  CALCIUM 8.8*   GFR: Estimated Creatinine Clearance: 69.9 mL/min (A) (by C-G formula based on SCr of 1.58 mg/dL (H)). Recent Labs  Lab 08/19/18 1907 08/19/18 1929 08/19/18 2311  WBC 12.9*  --   --   LATICACIDVEN  --  1.60 1.17   Liver Function Tests: Recent Labs  Lab 08/19/18 1907  AST 29  ALT 29  ALKPHOS 213*  BILITOT 0.9  PROT 7.1  ALBUMIN 2.9*   No results for input(s): LIPASE, AMYLASE in the last 168 hours. No results for input(s): AMMONIA in the last 168 hours. ABG    Component Value Date/Time   PHART 7.435 08/19/2018 1958   PCO2ART 38.9 08/19/2018 1958   PO2ART 404.0 (H) 08/19/2018 1958   HCO3 25.6 08/19/2018 1958   TCO2 27 08/19/2018 1958   O2SAT 100.0 08/19/2018 1958    Coagulation Profile: Recent Labs  Lab 08/19/18 1907  INR 1.08   Cardiac Enzymes: No results for input(s): CKTOTAL, CKMB, CKMBINDEX, TROPONINI in the last 168 hours. HbA1C: No results found for: HGBA1C CBG: No results for input(s): GLUCAP in the last 168 hours.   Review of Systems:   As per HPI, otherwise negative  Past medical history  He,  has a past medical history of Atrial fibrillation (HCC), COPD (chronic obstructive pulmonary disease) (HCC), DVT (deep venous thrombosis) (HCC), GERD (gastroesophageal reflux disease), Hypertension, Obesity, Renal disorder, and Tracheostomy in place [facility]).   Surgical History   History reviewed. No pertinent surgical history.   Social History   Social History   Socioeconomic History  .  Marital status: Divorced    Spouse name: Not on file  . Number of children: Not on file  . Years of education: Not on file  . Highest education level: Not on file  Occupational History  . Not on file  Social Needs  . Financial resource strain: Not on file  . Food insecurity:    Worry: Not on file    Inability: Not on file  . Transportation needs:    Medical: Not on file    Non-medical: Not on file  Tobacco Use  . Smoking status: Never Smoker  . Smokeless tobacco: Never Used  Substance and Sexual Activity  . Alcohol use: Not on file  . Drug use: Not on file  . Sexual activity: Not on file  Lifestyle  . Physical activity:    Days per week: Not on file    Minutes per session: Not on file  . Stress: Not on file  Relationships  . Social connections:    Talks  on phone: Not on file    Gets together: Not on file    Attends religious service: Not on file    Active member of club or organization: Not on file    Attends meetings of clubs or organizations: Not on file    Relationship status: Not on file  . Intimate partner violence:    Fear of current or ex partner: Not on file    Emotionally abused: Not on file    Physically abused: Not on file    Forced sexual activity: Not on file  Other Topics Concern  . Not on file  Social History Narrative  . Not on file  ,  reports that he has never smoked. He has never used smokeless tobacco.   Family history   His family history is not on file.   Allergies Allergies  Allergen Reactions  . Ace Inhibitors Other (See Comments)    On MAR  . Codeine Other (See Comments)    On MAR    Home meds  Prior to Admission medications   Medication Sig Start Date End Date Taking? Authorizing Provider  acetaminophen (TYLENOL) 500 MG tablet Place 500 mg into feeding tube 2 (two) times daily.   Yes [provider]  acetaminophen (TYLENOL) 650 MG CR tablet 650 mg every 6 (six) hours as needed for pain. Per tube   Yes [provider]  allopurinol (ZYLOPRIM) 100 MG tablet Place 100 mg into feeding tube every evening.   Yes [provider]  amiodarone (PACERONE) 200 MG tablet Place 200 mg into feeding tube daily.   Yes [provider]  aspirin 81 MG chewable tablet Chew 81 mg by mouth daily.   Yes [provider]  chlorhexidine (PERIDEX) 0.12 % solution Use as directed 15 mLs in the mouth or throat 2 (two) times daily.   Yes [provider]  diphenhydrAMINE (BENADRYL) 25 mg capsule Place 25 mg into feeding tube every 6 (six) hours as needed for itching or allergies.   Yes [provider]  fluticasone (FLONASE) 50 MCG/ACT nasal spray Place 2 sprays into both nostrils daily.   Yes [provider]  guaifenesin (ROBITUSSIN) 100 MG/5ML syrup Place 200 mg into feeding tube every 4 (four) hours as needed for cough.   Yes [provider]  heparin 5000 UNIT/ML injection Inject 5,000 Units into the skin every 8 (eight) hours.   Yes [provider]  ipratropium-albuterol (DUONEB) 0.5-2.5 (3) MG/3ML SOLN Take 3 mLs by nebulization every 6 (six) hours.   Yes [provider]  lansoprazole (PREVACID SOLUTAB) 30 MG disintegrating tablet Place 30 mg into feeding tube daily.   Yes [provider]  LORazepam (ATIVAN) 1 MG tablet Place 1 mg into feeding tube every 6 (six) hours as needed for anxiety.   Yes [provider]  magnesium hydroxide (MILK OF MAGNESIA) 400 MG/5ML suspension Place 30 mLs into feeding tube daily as needed for mild constipation.   Yes [provider]  Melatonin 3 MG TABS Place 3 mg into feeding tube at bedtime.   Yes [provider]  metoprolol tartrate (LOPRESSOR) 25 MG tablet Place 25 mg into feeding tube 2 (two) times daily.   Yes [provider]  Nutritional Supplements (FEEDING SUPPLEMENT, OSMOLITE 1.5 CAL,) LIQD Place 1,000 mLs into feeding tube continuous.   Yes [provider]  ondansetron (ZOFRAN) 4 MG tablet Place 4 mg into feeding tube every 6 (six) hours as needed for nausea  or vomiting.   Yes [provider]  ondansetron (ZOFRAN) 40 MG/20ML SOLN injection Inject 4 mg into the vein every 6 (six) hours as needed for nausea or vomiting.   Yes [provider]  phenol (CHLORASEPTIC) 1.4 % LIQD Use as directed 1 spray in the mouth or throat every 4 (four) hours as needed for throat irritation / pain.   Yes [provider]  polyethylene glycol (MIRALAX / GLYCOLAX) packet Place 17 g into feeding tube daily as needed for mild constipation.   Yes [provider]  potassium chloride SA (K-DUR,KLOR-CON) 20 MEQ tablet 20 mEq every evening. Per tube   Yes [provider]  sertraline (ZOLOFT) 100 MG tablet Place 100 mg into feeding tube at bedtime.   Yes [provider]    Posey Boyer, AGACNP-BC [facility] Pgr: [phone] or if no answer [phone] 08/20/2018, 1:45 AM

## 2018-08-20 NOTE — H&P (Signed)
TRH H&P   Patient Demographics:    Alex Reilly, is a 67 y.o. male  MRN: [id number]   DOB - Mar 04, 1951  Admit Date - 08/19/2018  Outpatient Primary MD for the patient is [name], MD  Referring MD/NP/PA: Pamala Duffel  Outpatient Specialists:     Patient coming from: [facility]  Chief Complaint  Patient presents with  . Fever      HPI:    Alex Reilly  is a 67 y.o. male, w chronic respiratory failure s/p trach and ventillator dependence. Copd, Pafib, hypertension,  CKD stage 3,chronic ileus with chronic NG tube and chronic indwelling foley catheter apparently sent for evaluation of fever, and hypotension.   Pt notes slight left hip pain as well as burning with urination  ?  In ED,  T 102.1, P 110, Bp 74/54, pox 100%  Na 141, K 4.1, Bun 19, Creatinine 1.58 Ast 29, Alt 29, Alk Phos 213 T. bili 0.9  Wbc 12.9, Hgb 11.0, Plt 298 INR 1.08 Urinalysis nitrite positive, le positive Rbc 11-20, wbc 6-10  Lactic acid 1.60  Ph 7.435, Pco2 38.9, Po2 404   CXR IMPRESSION: 1. The tip of the endotracheal tube is 5.3 cm above the carina. 2. The tip of the gastric tube lies inferior to the diaphragm, presumably within the stomach 3. Stable appearance of the chest with chronic elevation of the right hemidiaphragm, overall low inspiratory volumes and probable bibasilar atelectasis.   CT scan abd/ pelvis IMPRESSION: 1. Unchanged marked dilatation of the colon again noted (primarily transverse) containing gas and some fluid without obstructing cause. This is unchanged from 05/06/2018 likely representing a chronic ileus. No evidence of small bowel obstruction, abscess or pneumoperitoneum. 2. No acute abnormalities identified. 3. Cardiomegaly, coronary artery disease and unchanged mild bibasilar atelectasis. 4.  Aortic Atherosclerosis (ICD10-I70.0).   Pt will be  admitted for sepsis (fever, tachycardia, leukocytosis, hypotension), likely secondary to UTI      Review of systems:    In addition to the HPI above,  No Fever-chills, No Headache, No changes with Vision or hearing, No problems swallowing food or Liquids, No Chest pain, Cough or Shortness of Breath, No Abdominal pain, No Nausea or Vommitting, Bowel movements are regular, No Blood in stool or Urine, No dysuria, No new skin rashes or bruises, No new joints pains-aches,  No new weakness, tingling, numbness in any extremity, No recent weight gain or loss, No polyuria, polydypsia or polyphagia, No significant Mental Stressors.  A full 10 point Review of Systems was done, except as stated above, all other Review of Systems were negative.   With Past History of the following :    Past Medical History:  Diagnosis Date  . Atrial fibrillation ([place])   . COPD (chronic obstructive pulmonary disease) ([place])   . DVT (deep venous thrombosis) ([place])   . GERD (gastroesophageal reflux disease)   .  Hypertension   . Obesity   . Renal disorder   . Tracheostomy in place [facility])       History reviewed. No pertinent surgical history.    Social History:     Social History   Tobacco Use  . Smoking status: Never Smoker  . Smokeless tobacco: Never Used  Substance Use Topics  . Alcohol use: Not on file     Lives - [facility]   Mobility - unable to walk   Family History :    History reviewed. No pertinent family history. Unable to obtain due to patient on vent    Home Medications:   Prior to Admission medications   Medication Sig Start Date End Date Taking? Authorizing Provider  acetaminophen (TYLENOL) 500 MG tablet Place 500 mg into feeding tube 2 (two) times daily.   Yes [provider]  acetaminophen (TYLENOL) 650 MG CR tablet 650 mg every 6 (six) hours as needed for pain. Per tube   Yes [provider]  allopurinol (ZYLOPRIM) 100 MG tablet Place 100 mg into  feeding tube every evening.   Yes [provider]  amiodarone (PACERONE) 200 MG tablet Place 200 mg into feeding tube daily.   Yes [provider]  aspirin 81 MG chewable tablet Chew 81 mg by mouth daily.   Yes [provider]  chlorhexidine (PERIDEX) 0.12 % solution Use as directed 15 mLs in the mouth or throat 2 (two) times daily.   Yes [provider]  diphenhydrAMINE (BENADRYL) 25 mg capsule Place 25 mg into feeding tube every 6 (six) hours as needed for itching or allergies.   Yes [provider]  fluticasone (FLONASE) 50 MCG/ACT nasal spray Place 2 sprays into both nostrils daily.   Yes [provider]  guaifenesin (ROBITUSSIN) 100 MG/5ML syrup Place 200 mg into feeding tube every 4 (four) hours as needed for cough.   Yes [provider]  heparin 5000 UNIT/ML injection Inject 5,000 Units into the skin every 8 (eight) hours.   Yes [provider]  ipratropium-albuterol (DUONEB) 0.5-2.5 (3) MG/3ML SOLN Take 3 mLs by nebulization every 6 (six) hours.   Yes [provider]  lansoprazole (PREVACID SOLUTAB) 30 MG disintegrating tablet Place 30 mg into feeding tube daily.   Yes [provider]  LORazepam (ATIVAN) 1 MG tablet Place 1 mg into feeding tube every 6 (six) hours as needed for anxiety.   Yes [provider]  magnesium hydroxide (MILK OF MAGNESIA) 400 MG/5ML suspension Place 30 mLs into feeding tube daily as needed for mild constipation.   Yes [provider]  Melatonin 3 MG TABS Place 3 mg into feeding tube at bedtime.   Yes [provider]  metoprolol tartrate (LOPRESSOR) 25 MG tablet Place 25 mg into feeding tube 2 (two) times daily.   Yes [provider]  Nutritional Supplements (FEEDING SUPPLEMENT, OSMOLITE 1.5 CAL,) LIQD Place 1,000 mLs into feeding tube continuous.   Yes [provider]  ondansetron (ZOFRAN) 4 MG tablet Place 4 mg into feeding tube every  6 (six) hours as needed for nausea or vomiting.   Yes [provider]  ondansetron (ZOFRAN) 40 MG/20ML SOLN injection Inject 4 mg into the vein every 6 (six) hours as needed for nausea or vomiting.   Yes [provider]  phenol (CHLORASEPTIC) 1.4 % LIQD Use as directed 1 spray in the mouth or throat every 4 (four) hours as needed for throat irritation / pain.   Yes [provider]  polyethylene glycol (MIRALAX / GLYCOLAX) packet Place 17 g into feeding tube daily as needed for mild constipation.   Yes [provider]  potassium chloride SA (K-DUR,KLOR-CON) 20 MEQ tablet 20 mEq every evening. Per tube   Yes [provider]  sertraline (ZOLOFT) 100 MG tablet Place 100 mg into feeding tube at bedtime.   Yes [provider]     Allergies:     Allergies  Allergen Reactions  . Ace Inhibitors Other (See Comments)    On MAR  . Codeine Other (See Comments)    On MAR     Physical Exam:   Vitals  Blood pressure (!) 97/54, pulse 70, temperature (!) 102.1 F (38.9 C), temperature source Oral, resp. rate 19, height 5' 5"  (1.651 m), weight (!) 148.1 kg, SpO2 100 %.   1. General  lying in bed in NAD,    2. Normal affect and insight, Not Suicidal or Homicidal, Awake Alert,   3. No F.N deficits, ALL C.Nerves Intact, Strength 5/5 all 4 extremities, Sensation intact all 4 extremities, Plantars down going.  4. Ears and Eyes appear Normal, Conjunctivae clear, PERRLA. Moist Oral Mucosa.  5. Supple Neck, No JVD, No cervical lymphadenopathy appriciated, No Carotid Bruits. Tracheostomy in place  6. Symmetrical Chest wall movement, Good air movement bilaterally, CTAB.  7. RRR, No Gallops, Rubs or Murmurs, No Parasternal Heave.  8.  Slightly distended Positive Bowel Sounds, Abdomen Soft, No tenderness, No organomegaly appriciated,No rebound -guarding or rigidity.  9.  No Cyanosis, Normal Skin Turgor, No Skin Rash or Bruise.  10. Good muscle  tone,  joints appear normal , no effusions, Normal ROM.  11. No Palpable Lymph Nodes in Neck or Axillae  Foley in placed, urine appears cloudy    Data Review:    CBC Recent Labs  Lab 08/19/18 1907  WBC 12.9*  HGB 11.0*  HCT 35.5*  PLT 298  MCV 88.1  MCH 27.3  MCHC 31.0  RDW 16.7*  LYMPHSABS 0.3*  MONOABS 0.2  EOSABS 0.0  BASOSABS 0.0   ------------------------------------------------------------------------------------------------------------------  Chemistries  Recent Labs  Lab 08/19/18 1907  NA 141  K 4.1  CL 105  CO2 25  GLUCOSE 102*  BUN 19  CREATININE 1.58*  CALCIUM 8.8*  AST 29  ALT 29  ALKPHOS 213*  BILITOT 0.9   ------------------------------------------------------------------------------------------------------------------ estimated creatinine clearance is 61.7 mL/min (A) (by C-G formula based on SCr of 1.58 mg/dL (H)). ------------------------------------------------------------------------------------------------------------------ No results for input(s): TSH, T4TOTAL, T3FREE, THYROIDAB in the last 72 hours.  Invalid input(s): FREET3  Coagulation profile Recent Labs  Lab 08/19/18 1907  INR 1.08   ------------------------------------------------------------------------------------------------------------------- No results for input(s): DDIMER in the last 72 hours. -------------------------------------------------------------------------------------------------------------------  Cardiac Enzymes No results for input(s): CKMB, TROPONINI, MYOGLOBIN in the last 168 hours.  Invalid input(s): CK ------------------------------------------------------------------------------------------------------------------ No results found for: BNP   ---------------------------------------------------------------------------------------------------------------  Urinalysis    Component Value Date/Time   COLORURINE YELLOW 08/19/2018 1916   APPEARANCEUR  CLOUDY (A) 08/19/2018 1916   LABSPEC 1.015 08/19/2018 1916   PHURINE 7.0 08/19/2018 1916   GLUCOSEU NEGATIVE 08/19/2018 1916   HGBUR MODERATE (A) 08/19/2018 1916   BILIRUBINUR NEGATIVE 08/19/2018 1916   KETONESUR NEGATIVE 08/19/2018 1916   PROTEINUR 30 (A) 08/19/2018 1916   NITRITE POSITIVE (A) 08/19/2018 1916   LEUKOCYTESUR MODERATE (A) 08/19/2018 1916    ----------------------------------------------------------------------------------------------------------------   Imaging Results:    Ct Abdomen Pelvis W Contrast  Addendum Date: 08/19/2018   ADDENDUM REPORT: 08/19/2018  21:25 ADDENDUM: The IMPRESSION should also include: Foley catheter tip and balloon within the urethra. Recommend advancement into the bladder after balloon deflation. Electronically Signed   By: Margarette Canada M.D.   On: 08/19/2018 21:25   Result Date: 08/19/2018 CLINICAL DATA:  67 year old male with acute abdominal pain with fever. EXAM: CT ABDOMEN AND PELVIS WITH CONTRAST TECHNIQUE: Multidetector CT imaging of the abdomen and pelvis was performed using the standard protocol following bolus administration of intravenous contrast. CONTRAST:  129m ISOVUE-300 IOPAMIDOL (ISOVUE-300) INJECTION 61% COMPARISON:  07/08/2018 and prior CTs FINDINGS: Lower chest: Cardiomegaly, coronary artery disease and bibasilar atelectasis again noted. Hepatobiliary: No significant hepatic or gallbladder abnormalities. No biliary dilatation. Pancreas: Unremarkable Spleen: Unremarkable Adrenals/Urinary Tract: The kidneys, adrenal glands and bladder are unremarkable. A Foley catheter is noted with tip and inflated balloon in the urethra. Stomach/Bowel: NG tube within the stomach is noted. Again noted is marked dilatation of the transverse colon and filled mainly with gas. Fluid within portions of the ascending and transverse colon are noted. The transverse colon measures up to 16 cm in greatest diameter. No small bowel dilatation identified. No definite  bowel wall thickening or inflammatory changes noted. Vascular/Lymphatic: Aortic atherosclerosis. An IVC filter is again noted. No enlarged abdominal or pelvic lymph nodes. Reproductive: No significant prostate abnormalities. Other: No free fluid, abscess or pneumoperitoneum. Musculoskeletal: No acute or suspicious bony abnormalities. IMPRESSION: 1. Unchanged marked dilatation of the colon again noted (primarily transverse) containing gas and some fluid without obstructing cause. This is unchanged from 05/06/2018 likely representing a chronic ileus. No evidence of small bowel obstruction, abscess or pneumoperitoneum. 2. No acute abnormalities identified. 3. Cardiomegaly, coronary artery disease and unchanged mild bibasilar atelectasis. 4.  Aortic Atherosclerosis (ICD10-I70.0). Electronically Signed: By: JMargarette CanadaM.D. On: 08/19/2018 21:17   Dg Chest Portable 1 View  Result Date: 08/19/2018 CLINICAL DATA:  67year old male with new onset fever EXAM: PORTABLE CHEST 1 VIEW COMPARISON:  Prior chest x-ray 07/08/2018 FINDINGS: Stable cardiomegaly. Mediastinal contours are unchanged. The patient is intubated. The endotracheal tube tip is 5.3 cm from the carina. A gastric tube is present. The tip lies below the diaphragm, presumably in the stomach. Similar appearance of the lungs with low inspiratory volumes and chronic elevation of the right hemidiaphragm. Nonspecific bibasilar opacities may reflect atelectasis or infiltrate but appears similar compared to prior. Mild vascular congestion, no overt edema. IMPRESSION: 1. The tip of the endotracheal tube is 5.3 cm above the carina. 2. The tip of the gastric tube lies inferior to the diaphragm, presumably within the stomach 3. Stable appearance of the chest with chronic elevation of the right hemidiaphragm, overall low inspiratory volumes and probable bibasilar atelectasis. Electronically Signed   By: HJacqulynn CadetM.D.   On: 08/19/2018 19:34       Assessment &  Plan:    Principal Problem:   Sepsis ([place] Active Problems:   Urinary tract infection associated with indwelling urethral catheter (HCC)   Anemia   Renal insufficiency   Sepsis (Fever, tachycardia, leukocytosis, hypotension) Blood culture x2 Urine culture pending Vanco iv , Cefepime iv pharmacy to dose  UTI (complicated with chronic catheter) abx as above  Leukocytosis, Anemia Check cbc in am  Hypotension Tele Trop iq6hx 3 Check cortisol Check cardiac echo  Renal insufficiency Hydrate with ns iv Check cmp in am  Chronic ileus Cont NGT Cont Zofran  Chronic respiratory failure (hypoxic) Pulmonary consulted  Pafib Cont Amiodarone 2076mpeg qday Cont Metoprolol 2578meg bid Cont  aspirin 34m po qday  Copd Cont duoneb  Gout Cont Allopurinol 1015mpo qday     DVT Prophylaxis Heparin -  - SCDs   AM Labs Ordered, also please review Full Orders  Family Communication: Admission, patients condition and plan of care including tests being ordered have been discussed with the patient  who indicate understanding and agree with the plan and Code Status.  Code Status  FULL CODE  Likely DC to [facility]  Condition GUARDED / Critical  Consults called: PCCM by ED  Admission status: inpatient, pt will require iv abx, and iv ns for hypotension .  Due to sepsis, unlikely to clear in 2 days will require >=2 nites stay for inpatient admission. Severity of sepsis noted by significant hypotension on arrival.  Time spent in minutes : 70   JaJani Gravel.D on 08/20/2018 at 1:54 AM  Between 7am to 7pm - Pager - [phone] After 7pm go to [email or web address] - password [email or web address] - Office  [phone]

## 2018-08-20 NOTE — Progress Notes (Addendum)
Alex Reilly  IBB:[id number] DOB: 06-Jan-1951 DOA: 08/19/2018 PCP: Sherron Monday, MD    LOS: 0 days   Reason for Consult / Chief Complaint:  Chronic Vent Management/ fever and hypotension  Consulting MD and date of consult:  Dr. Fredderick Phenix 9/5  HPI / Summary of hospital stay:  HPI obtained from medical chart review and partially from patient, although is limited as patient is non-verbal with trach but attempts to mouth words.   67 year old male from [facility] with PMH significant for chronic respiratory failure, s/p trach and ventilator dependence, COPD, PAF, CKD, HTN, chronic ileus with chronic NGT tube, and chronic indwelling foley catheter sent for evaluation of fever and hypotension.  Some reports of NGT backing up and vomiting but patient denies any acute abdominal pain or nausea.  Unclear why patient has chronic NGT.  He attempts to relay that he has had some speech therapy with PMV and hopes to avoid Gtube, despite chronic ileus.  CXR without evidence of acute event.  ABG normal on current ventilator settings.  He was given 30 ml/kg bolus with resolution of hypotension and empirically started on vancomycin, cefepime, and flagyl.  Workup showing UTI.  Normal lactate.  He has remained alert and able to communicate at his baseline and hemodynamically stable, therefore will be admitted by [facility], and PCCM will consult for chronic ventilator management.   Subjective  Pt asking for in-line speaking valve.  Denies shortness of breath. RN reports NGT clogged this am.    Assessment & Plan:  Chronic respiratory failure, chronic trach and MV dependence Aspiration risk COPD - no current evidence of acute respiratory issue P:  Continue baseline vent settings: PCV18/5, 40% Trach care per protocol Consider trach change before transfer back to [facility]  Will ask SLP to get patient an inline speaking valve for communication Duoneb Q6 with PRN albuterol  Aspiration precautions Resume TF  at trickle for next 24 hours > then advance if tolerated per primary PPI for SUP   Best Practice / Goals of Care / Disposition.   DVT prophylaxis: heparin SQ GI prophylaxis: Pepcid  Diet:  TF Mobility: bedrest, advance as able Code Status: Full  Family Communication: Patient updated on plan of care.  No family present at bedside.   Disposition / Summary of Today's Plan 08/20/18   SDU status, care per TRH.  PCCM managing vent.    Consultants: date of consult/date signed off (if applicable)/final recs  [facility] primary team  Procedures: 9/5 Foley catheter repositioned  Significant Diagnostic Tests: 9/5 CT abd/pelvis >> Unchanged marked dilatation of the colon again noted (primarily transverse) containing gas and some fluid without obstructing cause. This is unchanged from 05/06/2018 likely representing a chronic ileus. No evidence of small bowel obstruction, abscess or pneumoperitoneum. No acute abnormalities identified. Cardiomegaly, coronary artery disease and unchanged mild bibasilar atelectasis. Aortic Atherosclerosis   Micro Data: 9/6 MRSA PCR >> negative 9/6 UC >>  9/6 BCx 2 >>  Antimicrobials:  9/5 vancomycin >> 9/5 flagyl >> 9/5 cefepime >>  Objective    Examination: General: adult male lying in bed  HEENT: MM pink/moist, #6 proximal XLT trach midline c/d/i  Neuro: Awake, alert, communicates appropriately, generalized weakness  CV: s1s2 rrr, no m/r/g PULM: even/non-labored, lungs bilaterally clear  GI: impressive abdominal distention, large/protruberant, decreased bowel sounds, pt denies tenderness   Extremities: warm/dry, trace generalized edema  Skin: no rashes or lesions  Blood pressure (!) 88/70, pulse 63, temperature 99.5 F (  37.5 C), temperature source Oral, resp. rate (!) 21, height 5\' 5"  (1.651 m), weight (!) 148.1 kg, SpO2 100 %.    Vent Mode: PCV FiO2 (%):  [40 %-100 %] 40 % Set Rate:  [18 bmp] 18 bmp PEEP:  [5 cmH20] 5 cmH20 Pressure Support:  [5  cmH20] 5 cmH20 Plateau Pressure:  [15 cmH20-19 cmH20] 16 cmH20   Intake/Output Summary (Last 24 hours) at 08/20/2018 1412 Last data filed at 08/20/2018 1100 Gross per 24 hour  Intake 6253.99 ml  Output 340 ml  Net 5913.99 ml   Filed Weights   08/19/18 1901 08/20/18 0152  Weight: (!) 170 kg (!) 148.1 kg     Labs    CBC: Recent Labs  Lab 08/19/18 1907 08/20/18 0237  WBC 12.9* 23.4*  NEUTROABS 12.2*  --   HGB 11.0* 9.5*  HCT 35.5* 30.7*  MCV 88.1 88.5  PLT 298 283   Basic Metabolic Panel: Recent Labs  Lab 08/19/18 1907 08/20/18 0237 08/20/18 0748 08/20/18 1216  NA 141 142  --  142  K 4.1 3.4*  --  3.5  CL 105 107  --  110  CO2 25 24  --  23  GLUCOSE 102* 101*  --  84  BUN 19 17  --  18  CREATININE 1.58* 1.74*  1.73*  --  1.61*  CALCIUM 8.8* 8.3*  --  8.4*  MG  --   --  1.8  --    GFR: Estimated Creatinine Clearance: 60.5 mL/min (A) (by C-G formula based on SCr of 1.61 mg/dL (H)). Recent Labs  Lab 08/19/18 1907 08/19/18 1929 08/19/18 2311 08/20/18 0237 08/20/18 0249  PROCALCITON  --   --   --   --  61.68  WBC 12.9*  --   --  23.4*  --   LATICACIDVEN  --  1.60 1.17  --   --    Liver Function Tests: Recent Labs  Lab 08/19/18 1907 08/20/18 0237  AST 29 25  ALT 29 26  ALKPHOS 213* 175*  BILITOT 0.9 1.0  PROT 7.1 6.5  ALBUMIN 2.9* 2.6*   No results for input(s): LIPASE, AMYLASE in the last 168 hours. No results for input(s): AMMONIA in the last 168 hours. ABG    Component Value Date/Time   PHART 7.435 08/19/2018 1958   PCO2ART 38.9 08/19/2018 1958   PO2ART 404.0 (H) 08/19/2018 1958   HCO3 25.6 08/19/2018 1958   TCO2 27 08/19/2018 1958   O2SAT 100.0 08/19/2018 1958    Coagulation Profile: Recent Labs  Lab 08/19/18 1907  INR 1.08   Cardiac Enzymes: Recent Labs  Lab 08/20/18 0237 08/20/18 0748  TROPONINI 0.05* 0.04*   HbA1C: No results found for: HGBA1C   CBG: Recent Labs  Lab 08/20/18 0329  GLUCAP 89     Canary Brim,  NP-C [facility] Pgr: [phone] or if no answer [phone] 08/20/2018, 2:12 PM  Attending Note:  67 year old male with chronic vent/trach for COPD and chronic critical illness.  On exam, extremely distended abdomen with coarse BS diffusely.  I reviewed CXR myself, trach is in a good position.  Discussed with PCCM-NP.    Chronic respiratory failure:  - Maintain on full vent support  Trach status:  - Place speaking inline speaking valve  - Maintain current trach size and type  Hypoxemia:   - Titrate O2 for sat of 88-92%  PCCM will continue to follow.  Patient seen and examined, agree with  above note.  I dictated the care and orders written for this patient under my direction.  Rush Farmer, [place]

## 2018-08-21 ENCOUNTER — Inpatient Hospital Stay (HOSPITAL_COMMUNITY): Payer: Medicare Other

## 2018-08-21 DIAGNOSIS — T83511D Infection and inflammatory reaction due to indwelling urethral catheter, subsequent encounter: Secondary | ICD-10-CM

## 2018-08-21 LAB — CBC
HCT: 28.8 % — ABNORMAL LOW (ref 39.0–52.0)
HEMOGLOBIN: 8.9 g/dL — AB (ref 13.0–17.0)
MCH: 27.1 pg (ref 26.0–34.0)
MCHC: 30.9 g/dL (ref 30.0–36.0)
MCV: 87.8 fL (ref 78.0–100.0)
PLATELETS: 254 10*3/uL (ref 150–400)
RBC: 3.28 MIL/uL — AB (ref 4.22–5.81)
RDW: 17 % — ABNORMAL HIGH (ref 11.5–15.5)
WBC: 10.5 10*3/uL (ref 4.0–10.5)

## 2018-08-21 LAB — COMPREHENSIVE METABOLIC PANEL
ALBUMIN: 2.5 g/dL — AB (ref 3.5–5.0)
ALK PHOS: 134 U/L — AB (ref 38–126)
ALT: 26 U/L (ref 0–44)
ANION GAP: 10 (ref 5–15)
AST: 21 U/L (ref 15–41)
BUN: 15 mg/dL (ref 8–23)
CHLORIDE: 113 mmol/L — AB (ref 98–111)
CO2: 21 mmol/L — ABNORMAL LOW (ref 22–32)
CREATININE: 1.54 mg/dL — AB (ref 0.61–1.24)
Calcium: 8.3 mg/dL — ABNORMAL LOW (ref 8.9–10.3)
GFR calc non Af Amer: 45 mL/min — ABNORMAL LOW (ref >60)
GFR, EST AFRICAN AMERICAN: 52 mL/min — AB (ref >60)
GLUCOSE: 92 mg/dL (ref 70–99)
Potassium: 3.4 mmol/L — ABNORMAL LOW (ref 3.5–5.1)
Sodium: 144 mmol/L (ref 135–145)
TOTAL PROTEIN: 6.4 g/dL — AB (ref 6.5–8.1)
Total Bilirubin: 0.7 mg/dL (ref 0.3–1.2)

## 2018-08-21 LAB — PROCALCITONIN: Procalcitonin: 56.23 ng/mL

## 2018-08-21 MED ORDER — POTASSIUM CHLORIDE 20 MEQ/15ML (10%) PO SOLN
40.0000 meq | Freq: Every day | ORAL | Status: DC
  Administered 2018-08-22 – 2018-08-26 (×5): 40 meq
  Filled 2018-08-21 (×5): qty 30

## 2018-08-21 MED ORDER — ALUM & MAG HYDROXIDE-SIMETH 200-200-20 MG/5ML PO SUSP
30.0000 mL | Freq: Four times a day (QID) | ORAL | Status: DC | PRN
  Administered 2018-08-21: 30 mL
  Filled 2018-08-21 (×2): qty 30

## 2018-08-21 NOTE — Progress Notes (Signed)
Patient placed on PMV while family was in the room. RT at bedside. Patient tolerating well at this time. Will continue to monitor.

## 2018-08-21 NOTE — Progress Notes (Signed)
PROGRESS NOTE   Alex Reilly  QIW:[id number]    DOB: 1951-10-12    DOA: 08/19/2018  PCP: [name], MD   I have briefly reviewed patients previous medical records in [facility].  Brief Narrative:  67 year old male transferred to [facility] from [facility], [place] significant for chronic hypoxic respiratory failure, status post tracheostomy and ventilator dependent, COPD, PAF, CKD stage III, HTN, chronic ileus with chronic NG tube feeding, chronic indwelling Foley catheter presented with fever and hypotension.  Admitted to stepdown unit for sepsis secondary to CAUTI.  Foley catheter was not in the appropriate position, was changed.  CCM consulted for vent management.   Assessment & Plan:   Principal Problem:   Sepsis ([place]) Active Problems:   Urinary tract infection associated with indwelling urethral catheter (HCC)   Anemia   Renal insufficiency   COPD (chronic obstructive pulmonary disease) (HCC)   Chronic respiratory failure with hypoxia (HCC)   Tracheostomy status ([place])   Sepsis due to suspected CAUTI: Patient met sepsis criteria on admission.  Blood cultures x2: Negative to date.  Urine culture pending.  MRSA PCR negative.  CT abdomen shows chronic ileus but no other acute abnormalities.  Chest x-ray shows chronic diaphragm elevation, low inspiratory volumes and atelectasis.  Treated per sepsis protocol with IV fluids and IV vancomycin and cefepime.  Preliminary urine cultures show >100,000 colonies per mL of Provedentia stuartii & E. coli, susceptibilities pending.  1 of 2 blood cultures shows coagulase-negative staph, BCID negative, likely contaminant.  Discontinued vancomycin.  Urinary retention/chronic indwelling Foley catheter: Foley catheter tip and balloon were within the urethra.  Foley catheter was changed this admission.  Acute on chronic kidney disease: Baseline creatinine not exactly known.  Had creatinine of 1.95 in July 2019.  Initial creatinine this admission  was 1.58 which increased to 1.73.  Likely related to sepsis, dehydration and poorly functioning Foley catheter.  Creatinine gradually improving, down to 1.54.  Follow BMP daily.  IV fluids now changed to [facility].  Hypokalemia: Replace and follow.  BMP 1.8.  Anemia: Possibly chronic disease.  No bleeding reported.  Hemoglobin is gradually dropped from 11-8.9 since admission.  No overt bleeding.  Some of this could be dilutional versus acute illness.  Follow CBC in a.m.  Leukocytosis: Secondary to sepsis.  Resolved.  Elevated troponin: No chest pain reported.  Flat trend.  Likely demand from sepsis.  Chronic ileus: Reported reason for not having G-tube.  Abdomen markedly distended.  Currently on NG tube with trickle feeds.?  Explore option of placing G-tube by general surgery this admission.  Reports of patient refusing G-tube at [place].  Patient however wants to have more discussion and is asking for PMV valve, discussed with CCM.  Gradually advance tube feeds with close monitoring.  Patient denies recent nausea or vomiting.  Chronic hypoxic respiratory failure/tracheostomy and vent dependent: Management per CCM, input appreciated.  Discussed with CCM.  Paroxysmal A. fib: Currently in sinus rhythm.  Continue amiodarone, metoprolol as blood pressure tolerates and aspirin.  Not on anticoagulation ? reason.  COPD: Stable without clinical bronchospasm.  Gout: Without acute flare.   DVT prophylaxis: SCDs Code Status: Full Family Communication: None at bedside Disposition: To be determined.   Consultants:  CCM  Procedures:  Patient has tracheostomy and chronic vent. Foley catheter changed this admission. NG tube.  Antimicrobials:  Vancomycin and cefepime.   Subjective: Oriented, able to mouth answers appropriately.  States that he feels okay.  Reports some pain  and swelling in his left wrist at IV site.  No chest pain or dyspnea reported.  ROS: As above, otherwise unable or  negative.  Objective:  Vitals:   08/21/18 1103 08/21/18 1135 08/21/18 1217 08/21/18 1421  BP: (!) 144/80 (!) 144/80    Pulse: (!) 58 61    Resp: 19 (!) 27    Temp:   98.8 F (37.1 C)   TempSrc:   Oral   SpO2:  100%  100%  Weight:      Height:        Examination:  General exam: Pleasant middle-aged male, moderately built, morbidly obese, lying comfortably supine in bed.  Stable. Respiratory system: Diminished breath sounds in the bases but otherwise clear to auscultation. Respiratory effort normal.  Stable. Cardiovascular system: S1 & S2 heard, RRR. No JVD, murmurs, rubs, gallops or clicks.  Trace bilateral ankle edema.  Stable. Gastrointestinal system: Abdomen is hugely distended, soft, nontender.  Lower midline surgical scar.  Upper midline ventral hernia.  Organomegaly or masses difficult to appreciate.  Tingling bowel sounds heard, possibly chronic.  Stable and unchanged. Central nervous system: Alert and oriented. No focal neurological deficits.  Follows instructions appropriately Extremities: Symmetric 5 x 5 power. Skin: No rashes, lesions or ulcers Psychiatry: Judgement and insight appear normal. Mood & affect appropriate.     Data Reviewed: I have personally reviewed following labs and imaging studies  CBC: Recent Labs  Lab 08/19/18 1907 08/20/18 0237 08/21/18 0443  WBC 12.9* 23.4* 10.5  NEUTROABS 12.2*  --   --   HGB 11.0* 9.5* 8.9*  HCT 35.5* 30.7* 28.8*  MCV 88.1 88.5 87.8  PLT 298 283 030   Basic Metabolic Panel: Recent Labs  Lab 08/19/18 1907 08/20/18 0237 08/20/18 0748 08/20/18 1216 08/21/18 0443  NA 141 142  --  142 144  K 4.1 3.4*  --  3.5 3.4*  CL 105 107  --  110 113*  CO2 25 24  --  23 21*  GLUCOSE 102* 101*  --  84 92  BUN 19 17  --  18 15  CREATININE 1.58* 1.74*  1.73*  --  1.61* 1.54*  CALCIUM 8.8* 8.3*  --  8.4* 8.3*  MG  --   --  1.8  --   --    Liver Function Tests: Recent Labs  Lab 08/19/18 1907 08/20/18 0237 08/21/18 0443   AST 29 25 21   ALT 29 26 26   ALKPHOS 213* 175* 134*  BILITOT 0.9 1.0 0.7  PROT 7.1 6.5 6.4*  ALBUMIN 2.9* 2.6* 2.5*   Coagulation Profile: Recent Labs  Lab 08/19/18 1907  INR 1.08   Cardiac Enzymes: Recent Labs  Lab 08/20/18 0237 08/20/18 0748 08/20/18 1424  TROPONINI 0.05* 0.04* 0.03*   HbA1C: No results for input(s): HGBA1C in the last 72 hours. CBG: Recent Labs  Lab 08/20/18 0329  GLUCAP 89    Recent Results (from the past 240 hour(s))  Culture, blood (Routine x 2)     Status: None (Preliminary result)   Collection Time: 08/19/18  7:07 PM  Result Value Ref Range Status   Specimen Description SITE NOT SPECIFIED  Final   Special Requests   Final    BOTTLES DRAWN AEROBIC AND ANAEROBIC Blood Culture adequate volume   Culture   Final    NO GROWTH 2 DAYS Performed at [facility], [address], [place], [place] [address]    Report Status PENDING  Incomplete  Culture, blood (Routine  x 2)     Status: Abnormal (Preliminary result)   Collection Time: 08/19/18  7:13 PM  Result Value Ref Range Status   Specimen Description SITE NOT SPECIFIED  Final   Special Requests   Final    BOTTLES DRAWN AEROBIC AND ANAEROBIC Blood Culture adequate volume   Culture  Setup Time   Final    GRAM POSITIVE COCCI IN BOTH AEROBIC AND ANAEROBIC BOTTLES CRITICAL RESULT CALLED TO, READ BACK BY AND VERIFIED WITH: PHARMD J CARNEY 08/20/18 AT 2015BY CM Performed at [facility], [place] [address] [place], [place] [address]    Culture STAPHYLOCOCCUS SPECIES (COAGULASE NEGATIVE) (A)  Final   Report Status PENDING  Incomplete  Blood Culture ID Panel (Reflexed)     Status: None   Collection Time: 08/19/18  7:13 PM  Result Value Ref Range Status   Enterococcus species NOT DETECTED NOT DETECTED Final   Listeria monocytogenes NOT DETECTED NOT DETECTED Final   Staphylococcus species NOT DETECTED NOT DETECTED Final   Staphylococcus aureus NOT DETECTED NOT DETECTED Final   Streptococcus  species NOT DETECTED NOT DETECTED Final   Streptococcus agalactiae NOT DETECTED NOT DETECTED Final   Streptococcus pneumoniae NOT DETECTED NOT DETECTED Final   Streptococcus pyogenes NOT DETECTED NOT DETECTED Final   Acinetobacter baumannii NOT DETECTED NOT DETECTED Final   Enterobacteriaceae species NOT DETECTED NOT DETECTED Final   Enterobacter cloacae complex NOT DETECTED NOT DETECTED Final   Escherichia coli NOT DETECTED NOT DETECTED Final   Klebsiella oxytoca NOT DETECTED NOT DETECTED Final   Klebsiella pneumoniae NOT DETECTED NOT DETECTED Final   Proteus species NOT DETECTED NOT DETECTED Final   Serratia marcescens NOT DETECTED NOT DETECTED Final   Haemophilus influenzae NOT DETECTED NOT DETECTED Final   Neisseria meningitidis NOT DETECTED NOT DETECTED Final   Pseudomonas aeruginosa NOT DETECTED NOT DETECTED Final   Candida albicans NOT DETECTED NOT DETECTED Final   Candida glabrata NOT DETECTED NOT DETECTED Final   Candida krusei NOT DETECTED NOT DETECTED Final   Candida parapsilosis NOT DETECTED NOT DETECTED Final   Candida tropicalis NOT DETECTED NOT DETECTED Final    Comment: Performed at [facility], [place] [address] [place], [place] [address]  Urine culture     Status: Abnormal (Preliminary result)   Collection Time: 08/19/18  7:19 PM  Result Value Ref Range Status   Specimen Description URINE, CATHETERIZED  Final   Special Requests NONE  Final   Culture (A)  Final    >=100,000 COLONIES/mL PROVIDENCIA STUARTII >=100,000 COLONIES/mL ESCHERICHIA COLI SUSCEPTIBILITIES TO FOLLOW Performed at [facility], [place] [address] [place], [place] [address]    Report Status PENDING  Incomplete  MRSA PCR Screening     Status: None   Collection Time: 08/20/18  1:55 AM  Result Value Ref Range Status   MRSA by PCR NEGATIVE NEGATIVE Final    Comment:        The GeneXpert MRSA Assay (FDA approved for NASAL specimens only), is one component of a comprehensive MRSA  colonization surveillance program. It is not intended to diagnose MRSA infection nor to guide or monitor treatment for MRSA infections. Performed at [facility], [place] [address], [place], [place] [address]          Radiology Studies: Ct Abdomen Pelvis W Contrast  Addendum Date: 08/19/2018   ADDENDUM REPORT: 08/19/2018 21:25 ADDENDUM: The IMPRESSION should also include: Foley catheter tip and balloon within the urethra. Recommend advancement into  the bladder after balloon deflation. Electronically Signed   By: Margarette Canada M.D.   On: 08/19/2018 21:25   Result Date: 08/19/2018 CLINICAL DATA:  67 year old male with acute abdominal pain with fever. EXAM: CT ABDOMEN AND PELVIS WITH CONTRAST TECHNIQUE: Multidetector CT imaging of the abdomen and pelvis was performed using the standard protocol following bolus administration of intravenous contrast. CONTRAST:  170m ISOVUE-300 IOPAMIDOL (ISOVUE-300) INJECTION 61% COMPARISON:  07/08/2018 and prior CTs FINDINGS: Lower chest: Cardiomegaly, coronary artery disease and bibasilar atelectasis again noted. Hepatobiliary: No significant hepatic or gallbladder abnormalities. No biliary dilatation. Pancreas: Unremarkable Spleen: Unremarkable Adrenals/Urinary Tract: The kidneys, adrenal glands and bladder are unremarkable. A Foley catheter is noted with tip and inflated balloon in the urethra. Stomach/Bowel: NG tube within the stomach is noted. Again noted is marked dilatation of the transverse colon and filled mainly with gas. Fluid within portions of the ascending and transverse colon are noted. The transverse colon measures up to 16 cm in greatest diameter. No small bowel dilatation identified. No definite bowel wall thickening or inflammatory changes noted. Vascular/Lymphatic: Aortic atherosclerosis. An IVC filter is again noted. No enlarged abdominal or pelvic lymph nodes. Reproductive: No significant prostate abnormalities. Other: No free fluid, abscess or  pneumoperitoneum. Musculoskeletal: No acute or suspicious bony abnormalities. IMPRESSION: 1. Unchanged marked dilatation of the colon again noted (primarily transverse) containing gas and some fluid without obstructing cause. This is unchanged from 05/06/2018 likely representing a chronic ileus. No evidence of small bowel obstruction, abscess or pneumoperitoneum. 2. No acute abnormalities identified. 3. Cardiomegaly, coronary artery disease and unchanged mild bibasilar atelectasis. 4.  Aortic Atherosclerosis (ICD10-I70.0). Electronically Signed: By: JMargarette CanadaM.D. On: 08/19/2018 21:17   Dg Chest Port 1 View  Result Date: 08/21/2018 CLINICAL DATA:  COPD. EXAM: PORTABLE CHEST 1 VIEW COMPARISON:  Chest x-ray dated August 19, 2018. FINDINGS: Unchanged tracheostomy and enteric tubes. Stable cardiomegaly and pulmonary vascular congestion. Unchanged low lung volumes with bibasilar atelectasis. No pleural effusion or pneumothorax. No acute osseous abnormality. Unchanged elevation of the right hemidiaphragm. IMPRESSION: Stable low volume chest with bibasilar atelectasis. Electronically Signed   By: WTitus DubinM.D.   On: 08/21/2018 07:41   Dg Chest Portable 1 View  Result Date: 08/19/2018 CLINICAL DATA:  67year old male with new onset fever EXAM: PORTABLE CHEST 1 VIEW COMPARISON:  Prior chest x-ray 07/08/2018 FINDINGS: Stable cardiomegaly. Mediastinal contours are unchanged. The patient is intubated. The endotracheal tube tip is 5.3 cm from the carina. A gastric tube is present. The tip lies below the diaphragm, presumably in the stomach. Similar appearance of the lungs with low inspiratory volumes and chronic elevation of the right hemidiaphragm. Nonspecific bibasilar opacities may reflect atelectasis or infiltrate but appears similar compared to prior. Mild vascular congestion, no overt edema. IMPRESSION: 1. The tip of the endotracheal tube is 5.3 cm above the carina. 2. The tip of the gastric tube lies  inferior to the diaphragm, presumably within the stomach 3. Stable appearance of the chest with chronic elevation of the right hemidiaphragm, overall low inspiratory volumes and probable bibasilar atelectasis. Electronically Signed   By: HJacqulynn CadetM.D.   On: 08/19/2018 19:34        Scheduled Meds: . allopurinol  100 mg Per Tube QPM  . amiodarone  200 mg Per Tube Daily  . aspirin  81 mg Oral Daily  . chlorhexidine gluconate (MEDLINE KIT)  15 mL Mouth Rinse BID  . docusate  100 mg Per Tube BID  . feeding  supplement (OSMOLITE 1.5 CAL)  1,000 mL Per Tube Q24H  . fluticasone  2 spray Each Nare Daily  . heparin  5,000 Units Subcutaneous Q8H  . ipratropium-albuterol  3 mL Nebulization Q6H  . mouth rinse  15 mL Mouth Rinse 10 times per day  . Melatonin  3 mg Per Tube QHS  . metoprolol tartrate  25 mg Per Tube BID  . pantoprazole sodium  40 mg Per Tube Daily  . potassium chloride  20 mEq Oral Daily  . sennosides  5 mL Per Tube QHS   Continuous Infusions: . ceFEPime (MAXIPIME) IV 2 g (08/21/18 1109)  . vancomycin Stopped (08/20/18 2049)     LOS: 1 day     Vernell Leep, MD, FACP, [facility]. [facility] Pager [phone]  If 7PM-7AM, please contact night-coverage [email or web address] Password [email or web address] 08/21/2018, 3:26 PM

## 2018-08-21 NOTE — Progress Notes (Signed)
RT and Speech Therapy met with patient for a PMV trial.  RT monitored patient's VTe and Mve.  RT suctioned the airway and slowly deflated pt's cuff, PMV and adapter for the vent circuit were placed.  Pt able to speak and carry on a full conversation after oral suctioning.  Pt's MVe and VTe dropped with the air leakage.  MVe alarm adjust down and RR alarm adjusted up. Pt's vitals remained stable and pt had no increased WOB.  Pt tolerated PMV well and wants to do it again so he can speak to the doctor.  Pt states he does not write.    When PMV trial was finished, the valve was removed and placed in the storage cup with the vent adapter. Pt's cuff was reinflated.  Pt's MVe and VTe returned to normal and was documented in the flowsheet.  The vent alarms were but back to the original settings for the MVe and RR.

## 2018-08-21 NOTE — Evaluation (Signed)
[facility] Speaking Valve - Evaluation Patient Details  Name: Alex Reilly MRN: [id number] Date of Birth: Dec 18, 1950  Today's Date: 08/21/2018 Time: 8546-2703 SLP Time Calculation (min) (ACUTE ONLY): 60 min  Past Medical History:  Past Medical History:  Diagnosis Date  . Atrial fibrillation (HCC)   . COPD (chronic obstructive pulmonary disease) (HCC)   . DVT (deep venous thrombosis) (HCC)   . GERD (gastroesophageal reflux disease)   . Hypertension   . Obesity   . Renal disorder   . Tracheostomy in place [facility])    Past Surgical History: History reviewed. No pertinent surgical history. HPI:  67 year old male transferred to [facility] from [facility], [place] significant for chronic hypoxic respiratory failure, status post tracheostomy and ventilator dependent, COPD, PAF, CKD stage III, HTN, chronic ileus with chronic NG tube feeding, chronic indwelling Foley catheter presented with fever and hypotension.  Admitted to stepdown unit for sepsis secondary to CAUTI.  Foley catheter was not in the appropriate position, was changed.  Per notes, pt was participating in PMV trials.    Assessment / Plan / Recommendation Clinical Impression  In-line PMV assessment completed in collaboration with RT, Alex Reilly.  Pre-cuff deflation Ppeak 24, VTe 719, PEEP 5, rate of 18, Fi01 40%.  Cuff deflated slowly over several minutes; tracheal suctioning provided.  Pt demonstrated appropriate drop in VTe.  Pt self-suctioned oral cavity and nares using yankauers and removed copious secretions.  PMV with 22 ml adapter placed in vent circuitry.  Pt required initial min cues for slowed breathing and intentional oral exhalation.  He achieved hoarse vocal quality; good volume of voice, and as session progressed began to talk enthusiastically. Intelligibility around 75-80%.  Vital signs remained stable; RT adjusted alarms to accommodate increased RR.  Pt verbalized comfort using PMV. At end of session, PMV removed, cuff was  re-inflated and prior vent settings were restored by RT.  Stated he uses valve daily at [facility], with full supervision.  Discussed with Alex Reilly our goal to use PMV at least twice/day permitted staffing is available for supervision.  Pt agrees.  SLP to follow for improved intelligibility with PMV, education.   SLP Visit Diagnosis: Aphonia (R49.1)    SLP Assessment  Patient needs continued Speech Lanaguage Pathology Services    Follow Up Recommendations  LTACH    Frequency and Duration Other (Comment)(3-5 x/week)  2 weeks    PMSV Trial PMSV was placed for: 30 Able to redirect subglottic air through upper airway: Yes Able to Attain Phonation: Yes Voice Quality: Normal Able to Expectorate Secretions: No attempts Level of Secretion Expectoration with PMSV: Oral;Other (comment)(nasal) Breath Support for Phonation: Mildly decreased Intelligibility: Intelligibility reduced Phrase: 75-100% accurate Sentence: 75-100% accurate Conversation: 50-74% accurate Respirations During Trial: 30 SpO2 During Trial: 100 % Behavior: Alert;Cooperative   Tracheostomy Tube  Trach: #6 XL proximal Additional Tracheostomy Tube Assessment Fenestrated: No    Vent Dependency  Vent Dependent: Yes Vent Mode: PCV Set Rate: 18 bmp PEEP: 5 cmH20 FiO2 (%): 40 % Weaning Trials: No    Cuff Deflation Trial  GO Tolerated Cuff Deflation: Yes Length of Time for Cuff Deflation Trial: 30 Behavior: Alert;Cooperative Cuff Deflation Trial - Comments: cuff slowly deflated over 2-4 minutes.  No adverse reaction, likely due to chronicity of trach        Blenda Mounts [place] 08/21/2018, 5:28 PM

## 2018-08-22 LAB — CBC
HCT: 30.6 % — ABNORMAL LOW (ref 39.0–52.0)
Hemoglobin: 9.4 g/dL — ABNORMAL LOW (ref 13.0–17.0)
MCH: 27.1 pg (ref 26.0–34.0)
MCHC: 30.7 g/dL (ref 30.0–36.0)
MCV: 88.2 fL (ref 78.0–100.0)
Platelets: 266 10*3/uL (ref 150–400)
RBC: 3.47 MIL/uL — AB (ref 4.22–5.81)
RDW: 17 % — AB (ref 11.5–15.5)
WBC: 7.6 10*3/uL (ref 4.0–10.5)

## 2018-08-22 LAB — PROCALCITONIN: Procalcitonin: 31.31 ng/mL

## 2018-08-22 LAB — BASIC METABOLIC PANEL
Anion gap: 9 (ref 5–15)
BUN: 12 mg/dL (ref 8–23)
CALCIUM: 8.6 mg/dL — AB (ref 8.9–10.3)
CO2: 22 mmol/L (ref 22–32)
CREATININE: 1.33 mg/dL — AB (ref 0.61–1.24)
Chloride: 113 mmol/L — ABNORMAL HIGH (ref 98–111)
GFR calc Af Amer: >60 mL/min (ref >60)
GFR, EST NON AFRICAN AMERICAN: 54 mL/min — AB (ref >60)
GLUCOSE: 119 mg/dL — AB (ref 70–99)
Potassium: 3.4 mmol/L — ABNORMAL LOW (ref 3.5–5.1)
SODIUM: 144 mmol/L (ref 135–145)

## 2018-08-22 LAB — GLUCOSE, CAPILLARY: GLUCOSE-CAPILLARY: 98 mg/dL (ref 70–99)

## 2018-08-22 LAB — URINE CULTURE: Culture: >=100000 — AB

## 2018-08-22 LAB — CULTURE, BLOOD (ROUTINE X 2): Special Requests: ADEQUATE

## 2018-08-22 MED ORDER — SIMETHICONE 40 MG/0.6ML PO SUSP
80.0000 mg | Freq: Four times a day (QID) | ORAL | Status: DC
  Administered 2018-08-22 – 2018-08-26 (×17): 80 mg
  Filled 2018-08-22 (×20): qty 1.2

## 2018-08-22 MED ORDER — SODIUM CHLORIDE 0.9 % IV SOLN
1.0000 g | INTRAVENOUS | Status: DC
  Administered 2018-08-22 – 2018-08-25 (×4): 1 g via INTRAVENOUS
  Filled 2018-08-22 (×6): qty 10

## 2018-08-22 MED ORDER — POLYETHYLENE GLYCOL 3350 17 G PO PACK
17.0000 g | PACK | Freq: Two times a day (BID) | ORAL | Status: DC
  Administered 2018-08-22 – 2018-08-26 (×9): 17 g
  Filled 2018-08-22 (×9): qty 1

## 2018-08-22 MED ORDER — BISACODYL 10 MG RE SUPP
10.0000 mg | Freq: Once | RECTAL | Status: AC
  Administered 2018-08-22: 10 mg via RECTAL
  Filled 2018-08-22: qty 1

## 2018-08-22 MED ORDER — POTASSIUM CHLORIDE 20 MEQ PO PACK
40.0000 meq | PACK | Freq: Once | ORAL | Status: AC
  Administered 2018-08-22: 40 meq
  Filled 2018-08-22: qty 2

## 2018-08-22 NOTE — Progress Notes (Addendum)
PROGRESS NOTE   Alex Reilly  LTJ:[id number]    DOB: May 04, 1951    DOA: 08/19/2018  PCP: [name], MD   I have briefly reviewed patients previous medical records in [facility].  Brief Narrative:  67 year old male transferred to [facility] from [facility] [place] significant for chronic hypoxic respiratory failure, status post tracheostomy and ventilator dependent, COPD, PAF, CKD stage III, HTN, chronic ileus with chronic NG tube feeding, chronic indwelling Foley catheter presented with fever and hypotension.  Admitted to stepdown unit for sepsis secondary to CAUTI.  Foley catheter was not in the appropriate position, was changed.  CCM consulted for vent management.  Slowly improving.   Assessment & Plan:   Principal Problem:   Sepsis ([place]) Active Problems:   Urinary tract infection associated with indwelling urethral catheter (HCC)   Anemia   Renal insufficiency   COPD (chronic obstructive pulmonary disease) (HCC)   Chronic respiratory failure with hypoxia (HCC)   Tracheostomy status (Alex Reilly)   Sepsis due to suspected CAUTI: Patient met sepsis criteria on admission.  Blood cultures x2: Negative to date.  Urine culture pending.  MRSA PCR negative.  CT abdomen shows chronic ileus but no other acute abnormalities.  Chest x-ray shows chronic diaphragm elevation, low inspiratory volumes and atelectasis.  Treated per sepsis protocol with IV fluids and IV vancomycin and cefepime. Urine cultures show >100,000 colonies per mL of Provedentia stuartii & E. coli, both sensitive to ceftriaxone.  DC cefepime and start IV ceftriaxone and plan 1 week of antibiotics.  1 of 2 blood cultures shows coagulase-negative staph, BCID negative, likely contaminant.  Discontinued vancomycin.  Sepsis resolved.  Urinary retention/chronic indwelling Foley catheter: Foley catheter tip and balloon were within the urethra.  Foley catheter was changed this admission.  Acute on chronic kidney disease: Baseline  creatinine not exactly known.  Had creatinine of 1.95 in July 2019.  Initial creatinine this admission was 1.58 which increased to 1.73.  Likely related to sepsis, dehydration and poorly functioning Foley catheter.  Creatinine gradually improving, down to 1.33.  Follow BMP daily.  IV fluids now changed to [facility].  Hypokalemia: Replace and follow.  BMP 1.8.  Anemia: Possibly chronic disease.  No bleeding reported.  Hemoglobin is gradually dropped from 11-8.9 since admission.  No overt bleeding.  Some of this could be dilutional versus acute illness.  Hemoglobin up to 9.4.  Leukocytosis: Secondary to sepsis.  Resolved.  Elevated troponin: No chest pain reported.  Flat trend.  Likely demand from sepsis.  Chronic ileus: Reported reason for not having G-tube.  Abdomen markedly distended.  Currently on NG tube with trickle feeds.?  Explore option of placing G-tube by general surgery this admission.  Reports of patient refusing G-tube at [facility].  Patient however wants to have more discussion and is asking for PMV valve, discussed with CCM.  Gradually advance tube feeds with close monitoring, dietitian consulted for same.  Patient denies recent nausea or vomiting.  Aggressive bowel regimen.  Chronic hypoxic respiratory failure/tracheostomy and vent dependent: Management per CCM, input appreciated.  Discussed with CCM.  Stable.  Paroxysmal A. fib: Currently in sinus rhythm.  Continue amiodarone, metoprolol as blood pressure tolerates and aspirin.  Not on anticoagulation ? reason.  COPD: Stable without clinical bronchospasm.  Gout: Without acute flare.   DVT prophylaxis: SCDs & subcutaneous heparin Code Status: Full Family Communication: None at bedside Disposition: To be determined,?  LTAC when stable hopefully in the next 24 to 48 hours.  Consultants:  CCM  Procedures:  Patient has tracheostomy and chronic vent. Foley catheter changed this admission. NG tube.  Antimicrobials:  Vancomycin  and cefepime, discontinued. IV ceftriaxone 9/8 >  Subjective: Reports that he feels gaseous in his belly and asking for medications.  As per nursing, had a large and a small BM yesterday.  No abdominal pain.  Tolerating trickle feeds, not advanced yet.  No other complaints reported.  Tolerated PMV valve well yesterday.  ROS: As above, otherwise unable or negative.  Objective:  Vitals:   08/22/18 0700 08/22/18 0736 08/22/18 0800 08/22/18 0810  BP: 118/64 118/64 113/61   Pulse: (!) 48 (!) 46 (!) 51   Resp: 18 (!) 22 18   Temp:    98.5 F (36.9 C)  TempSrc:    Oral  SpO2: 100% 100% 98%   Weight:      Height:        Examination:  General exam: Pleasant middle-aged male, moderately built, morbidly obese, lying comfortably supine in bed.  Stable. Respiratory system: Diminished breath sounds in the bases but otherwise clear to auscultation. Respiratory effort normal.  Stable without change. Cardiovascular system: S1 & S2 heard, RRR. No JVD, murmurs, rubs, gallops or clicks.  Trace bilateral ankle edema.  Telemetry personally reviewed: SB in the 50s-SR. Gastrointestinal system: Abdomen is hugely distended, soft, nontender.  Not tense.  Lower midline surgical scar.  Upper midline ventral hernia.  Organomegaly or masses difficult to appreciate.  Tingling bowel sounds heard, possibly chronic.  Stable without change. Central nervous system: Alert and oriented. No focal neurological deficits.  Follows instructions appropriately Extremities: Symmetric 5 x 5 power. Skin: No rashes, lesions or ulcers Psychiatry: Judgement and insight appear normal. Mood & affect appropriate.     Data Reviewed: I have personally reviewed following labs and imaging studies  CBC: Recent Labs  Lab 08/19/18 1907 08/20/18 0237 08/21/18 0443 08/22/18 0351  WBC 12.9* 23.4* 10.5 7.6  NEUTROABS 12.2*  --   --   --   HGB 11.0* 9.5* 8.9* 9.4*  HCT 35.5* 30.7* 28.8* 30.6*  MCV 88.1 88.5 87.8 88.2  PLT 298 283  254 250   Basic Metabolic Panel: Recent Labs  Lab 08/19/18 1907 08/20/18 0237 08/20/18 0748 08/20/18 1216 08/21/18 0443 08/22/18 0351  NA 141 142  --  142 144 144  K 4.1 3.4*  --  3.5 3.4* 3.4*  CL 105 107  --  110 113* 113*  CO2 25 24  --  23 21* 22  GLUCOSE 102* 101*  --  84 92 119*  BUN 19 17  --  18 15 12   CREATININE 1.58* 1.74*  1.73*  --  1.61* 1.54* 1.33*  CALCIUM 8.8* 8.3*  --  8.4* 8.3* 8.6*  MG  --   --  1.8  --   --   --    Liver Function Tests: Recent Labs  Lab 08/19/18 1907 08/20/18 0237 08/21/18 0443  AST 29 25 21   ALT 29 26 26   ALKPHOS 213* 175* 134*  BILITOT 0.9 1.0 0.7  PROT 7.1 6.5 6.4*  ALBUMIN 2.9* 2.6* 2.5*   Coagulation Profile: Recent Labs  Lab 08/19/18 1907  INR 1.08   Cardiac Enzymes: Recent Labs  Lab 08/20/18 0237 08/20/18 0748 08/20/18 1424  TROPONINI 0.05* 0.04* 0.03*   HbA1C: No results for input(s): HGBA1C in the last 72 hours. CBG: Recent Labs  Lab 08/20/18 0329  GLUCAP 89    Recent Results (from the past  240 hour(s))  Culture, blood (Routine x 2)     Status: None (Preliminary result)   Collection Time: 08/19/18  7:07 PM  Result Value Ref Range Status   Specimen Description SITE NOT SPECIFIED  Final   Special Requests   Final    BOTTLES DRAWN AEROBIC AND ANAEROBIC Blood Culture adequate volume   Culture   Final    NO GROWTH 3 DAYS Performed at [facility], [address] [place], [place] [address]    Report Status PENDING  Incomplete  Culture, blood (Routine x 2)     Status: Abnormal (Preliminary result)   Collection Time: 08/19/18  7:13 PM  Result Value Ref Range Status   Specimen Description SITE NOT SPECIFIED  Final   Special Requests   Final    BOTTLES DRAWN AEROBIC AND ANAEROBIC Blood Culture adequate volume   Culture  Setup Time   Final    GRAM POSITIVE COCCI IN BOTH AEROBIC AND ANAEROBIC BOTTLES CRITICAL RESULT CALLED TO, READ BACK BY AND VERIFIED WITH: PHARMD J CARNEY 08/20/18 AT 2015BY  CM Performed at [facility], [place] [address], [place], [place] [address]    Culture STAPHYLOCOCCUS SPECIES (COAGULASE NEGATIVE) (A)  Final   Report Status PENDING  Incomplete  Blood Culture ID Panel (Reflexed)     Status: None   Collection Time: 08/19/18  7:13 PM  Result Value Ref Range Status   Enterococcus species NOT DETECTED NOT DETECTED Final   Listeria monocytogenes NOT DETECTED NOT DETECTED Final   Staphylococcus species NOT DETECTED NOT DETECTED Final   Staphylococcus aureus NOT DETECTED NOT DETECTED Final   Streptococcus species NOT DETECTED NOT DETECTED Final   Streptococcus agalactiae NOT DETECTED NOT DETECTED Final   Streptococcus pneumoniae NOT DETECTED NOT DETECTED Final   Streptococcus pyogenes NOT DETECTED NOT DETECTED Final   Acinetobacter baumannii NOT DETECTED NOT DETECTED Final   Enterobacteriaceae species NOT DETECTED NOT DETECTED Final   Enterobacter cloacae complex NOT DETECTED NOT DETECTED Final   Escherichia coli NOT DETECTED NOT DETECTED Final   Klebsiella oxytoca NOT DETECTED NOT DETECTED Final   Klebsiella pneumoniae NOT DETECTED NOT DETECTED Final   Proteus species NOT DETECTED NOT DETECTED Final   Serratia marcescens NOT DETECTED NOT DETECTED Final   Haemophilus influenzae NOT DETECTED NOT DETECTED Final   Neisseria meningitidis NOT DETECTED NOT DETECTED Final   Pseudomonas aeruginosa NOT DETECTED NOT DETECTED Final   Candida albicans NOT DETECTED NOT DETECTED Final   Candida glabrata NOT DETECTED NOT DETECTED Final   Candida krusei NOT DETECTED NOT DETECTED Final   Candida parapsilosis NOT DETECTED NOT DETECTED Final   Candida tropicalis NOT DETECTED NOT DETECTED Final    Comment: Performed at [facility], [place] [address], [place], [place] [address]  Urine culture     Status: Abnormal   Collection Time: 08/19/18  7:19 PM  Result Value Ref Range Status   Specimen Description URINE, CATHETERIZED  Final   Special Requests   Final     NONE Performed at [facility], [place] [address], [place], [place] [address]    Culture (A)  Final    >=100,000 COLONIES/mL PROVIDENCIA STUARTII >=100,000 COLONIES/mL ESCHERICHIA COLI    Report Status 08/22/2018 FINAL  Final   Organism ID, Bacteria PROVIDENCIA STUARTII (A)  Final   Organism ID, Bacteria ESCHERICHIA COLI (A)  Final      Susceptibility   Escherichia coli - MIC*    AMPICILLIN >=32 RESISTANT Resistant  CEFAZOLIN <=4 SENSITIVE Sensitive     CEFTRIAXONE <=1 SENSITIVE Sensitive     CIPROFLOXACIN >=4 RESISTANT Resistant     GENTAMICIN <=1 SENSITIVE Sensitive     IMIPENEM <=0.25 SENSITIVE Sensitive     NITROFURANTOIN <=16 SENSITIVE Sensitive     TRIMETH/SULFA >=320 RESISTANT Resistant     AMPICILLIN/SULBACTAM >=32 RESISTANT Resistant     PIP/TAZO 8 SENSITIVE Sensitive     Extended ESBL NEGATIVE Sensitive     * >=100,000 COLONIES/mL ESCHERICHIA COLI   Providencia stuartii - MIC*    AMPICILLIN RESISTANT Resistant     CEFAZOLIN >=64 RESISTANT Resistant     CEFTRIAXONE <=1 SENSITIVE Sensitive     CIPROFLOXACIN >=4 RESISTANT Resistant     GENTAMICIN RESISTANT Resistant     IMIPENEM 1 SENSITIVE Sensitive     NITROFURANTOIN >=512 RESISTANT Resistant     TRIMETH/SULFA <=20 SENSITIVE Sensitive     AMPICILLIN/SULBACTAM 4 SENSITIVE Sensitive     PIP/TAZO <=4 SENSITIVE Sensitive     * >=100,000 COLONIES/mL PROVIDENCIA STUARTII  MRSA PCR Screening     Status: None   Collection Time: 08/20/18  1:55 AM  Result Value Ref Range Status   MRSA by PCR NEGATIVE NEGATIVE Final    Comment:        The GeneXpert MRSA Assay (FDA approved for NASAL specimens only), is one component of a comprehensive MRSA colonization surveillance program. It is not intended to diagnose MRSA infection nor to guide or monitor treatment for MRSA infections. Performed at [facility], [place] [address], [place], [place] [address]          Radiology Studies: Dg Chest Port 1  View  Result Date: 08/21/2018 CLINICAL DATA:  COPD. EXAM: PORTABLE CHEST 1 VIEW COMPARISON:  Chest x-ray dated August 19, 2018. FINDINGS: Unchanged tracheostomy and enteric tubes. Stable cardiomegaly and pulmonary vascular congestion. Unchanged low lung volumes with bibasilar atelectasis. No pleural effusion or pneumothorax. No acute osseous abnormality. Unchanged elevation of the right hemidiaphragm. IMPRESSION: Stable low volume chest with bibasilar atelectasis. Electronically Signed   By: Titus Dubin M.D.   On: 08/21/2018 07:41        Scheduled Meds: . allopurinol  100 mg Per Tube QPM  . amiodarone  200 mg Per Tube Daily  . aspirin  81 mg Oral Daily  . bisacodyl  10 mg Rectal Once  . chlorhexidine gluconate (MEDLINE KIT)  15 mL Mouth Rinse BID  . docusate  100 mg Per Tube BID  . feeding supplement (OSMOLITE 1.5 CAL)  1,000 mL Per Tube Q24H  . fluticasone  2 spray Each Nare Daily  . heparin  5,000 Units Subcutaneous Q8H  . ipratropium-albuterol  3 mL Nebulization Q6H  . mouth rinse  15 mL Mouth Rinse 10 times per day  . Melatonin  3 mg Per Tube QHS  . metoprolol tartrate  25 mg Per Tube BID  . pantoprazole sodium  40 mg Per Tube Daily  . potassium chloride  40 mEq Per Tube Daily  . sennosides  5 mL Per Tube QHS  . simethicone  80 mg Per Tube QID   Continuous Infusions: . ceFEPime (MAXIPIME) IV Stopped (08/22/18 0410)     LOS: 2 days     Vernell Leep, MD, FACP, [facility]. [facility] Pager [phone]  If 7PM-7AM, please contact night-coverage [email or web address] Password [email or web address] 08/22/2018, 9:12 AM

## 2018-08-22 NOTE — Progress Notes (Signed)
Pt placed on PMV with vent to be able to talk to RN at bedside.  RT deflated cuff, placed PMV and adapter for vent connection.  RT adjusted low MVe alarm down due to drop in VTe and MVe with cuff deflated.  RT and RN remained at bedside as patient was discussing his care. Pt tolerated PMV well and vitals remained stable.  After pt was finished asking and answering questions, RT removed PMV and re-inflated the cuff.  Pt's VTe and MVe returned to normal values. Low MVe alarm was returned to 4.0.

## 2018-08-23 LAB — BASIC METABOLIC PANEL
Anion gap: 9 (ref 5–15)
BUN: 9 mg/dL (ref 8–23)
CHLORIDE: 112 mmol/L — AB (ref 98–111)
CO2: 23 mmol/L (ref 22–32)
CREATININE: 1.3 mg/dL — AB (ref 0.61–1.24)
Calcium: 8.6 mg/dL — ABNORMAL LOW (ref 8.9–10.3)
GFR calc non Af Amer: 55 mL/min — ABNORMAL LOW (ref >60)
Glucose, Bld: 95 mg/dL (ref 70–99)
POTASSIUM: 3.5 mmol/L (ref 3.5–5.1)
SODIUM: 144 mmol/L (ref 135–145)

## 2018-08-23 LAB — GLUCOSE, CAPILLARY: GLUCOSE-CAPILLARY: 105 mg/dL — AB (ref 70–99)

## 2018-08-23 MED ORDER — PNEUMOCOCCAL VAC POLYVALENT 25 MCG/0.5ML IJ INJ
0.5000 mL | INJECTION | INTRAMUSCULAR | Status: AC
  Administered 2018-08-24: 0.5 mL via INTRAMUSCULAR
  Filled 2018-08-23: qty 0.5

## 2018-08-23 MED ORDER — PRO-STAT SUGAR FREE PO LIQD
30.0000 mL | Freq: Two times a day (BID) | ORAL | Status: DC
  Administered 2018-08-23: 30 mL
  Filled 2018-08-23: qty 30

## 2018-08-23 MED ORDER — ORAL CARE MOUTH RINSE
15.0000 mL | Freq: Four times a day (QID) | OROMUCOSAL | Status: DC
  Administered 2018-08-23 – 2018-08-26 (×11): 15 mL via OROMUCOSAL

## 2018-08-23 MED ORDER — SODIUM CHLORIDE 0.9 % IV SOLN: INTRAVENOUS | Status: DC | PRN

## 2018-08-23 MED ORDER — VITAL HIGH PROTEIN PO LIQD
1000.0000 mL | ORAL | Status: DC
  Administered 2018-08-23 – 2018-08-26 (×4): 1000 mL

## 2018-08-23 MED ORDER — INFLUENZA VAC SPLIT HIGH-DOSE 0.5 ML IM SUSY
0.5000 mL | PREFILLED_SYRINGE | INTRAMUSCULAR | Status: AC
  Administered 2018-08-24: 0.5 mL via INTRAMUSCULAR
  Filled 2018-08-23: qty 0.5

## 2018-08-23 MED ORDER — ADULT MULTIVITAMIN LIQUID CH
15.0000 mL | Freq: Every day | ORAL | Status: DC
  Administered 2018-08-23 – 2018-08-26 (×4): 15 mL via ORAL
  Filled 2018-08-23 (×4): qty 15

## 2018-08-23 MED ORDER — PRO-STAT SUGAR FREE PO LIQD
60.0000 mL | Freq: Two times a day (BID) | ORAL | Status: DC
  Administered 2018-08-23 – 2018-08-26 (×6): 60 mL
  Filled 2018-08-23 (×6): qty 60

## 2018-08-23 NOTE — Progress Notes (Signed)
PROGRESS NOTE   Alex Reilly  PQZ:[id number]    DOB: [birth year]    DOA: 08/19/2018  PCP: [name], MD   I have briefly reviewed patients previous medical records in [facility].  Brief Narrative:  67 year old male transferred to [facility] from [facility] [place] significant for chronic hypoxic respiratory failure, status post tracheostomy and ventilator dependent, COPD, PAF, CKD stage III, HTN, chronic ileus with chronic NG tube feeding, chronic indwelling Foley catheter presented with fever and hypotension.  Admitted to stepdown unit for sepsis secondary to CAUTI.  Foley catheter was not in the appropriate position, was changed.  CCM consulted for vent management.  Slowly improving.   Assessment & Plan:   Principal Problem:   Sepsis ([place]) Active Problems:   Urinary tract infection associated with indwelling urethral catheter (HCC)   Anemia   Renal insufficiency   COPD (chronic obstructive pulmonary disease) (HCC)   Chronic respiratory failure with hypoxia (HCC)   Tracheostomy status ([place])   Sepsis due to suspected CAUTI: Patient met sepsis criteria on admission.  Blood cultures x2: Negative to date.  Urine culture pending.  MRSA PCR negative.  CT abdomen shows chronic ileus but no other acute abnormalities.  Chest x-ray shows chronic diaphragm elevation, low inspiratory volumes and atelectasis.  Treated per sepsis protocol with IV fluids and IV vancomycin and cefepime. Urine cultures show >100,000 colonies per mL of Provedentia stuartii & E. coli, both sensitive to ceftriaxone.  DC cefepime and start IV ceftriaxone and plan 1 week of antibiotics.  1 of 2 blood cultures shows coagulase-negative staph, BCID negative, likely contaminant.  Discontinued vancomycin.  Sepsis resolved.?  Keflex at discharge to SNF in a.m.  Urinary retention/chronic indwelling Foley catheter: Foley catheter tip and balloon were within the urethra.  Foley catheter was changed this admission.  Acute on  chronic kidney disease: Baseline creatinine not exactly known.  Had creatinine of 1.95 in July 2019.  Initial creatinine this admission was 1.58 which increased to 1.73.  Likely related to sepsis, dehydration and poorly functioning Foley catheter.  Creatinine gradually improving, down to 1.33 and plateaued at 1.3 which may be his baseline.  Follow BMP daily.  IV fluids now changed to [facility].  Hypokalemia: Replace and follow.  BMP 1.8.  Anemia: Possibly chronic disease.  No bleeding reported.  Hemoglobin is gradually dropped from 11-8.9 since admission.  No overt bleeding.  Some of this could be dilutional versus acute illness.  Hemoglobin up to 9.4.  Leukocytosis: Secondary to sepsis.  Resolved.  Elevated troponin: No chest pain reported.  Flat trend.  Likely demand from sepsis.  Chronic ileus: Reported reason for not having G-tube.  Abdomen markedly distended.  Currently on NG tube with trickle feeds.  Reports of patient refusing G-tube at [place].  Patient however wants to have more discussion and is asking for PMV valve, discussed with CCM.  Gradually advance tube feeds with close monitoring, dietitian consulted for same.  Patient denies recent nausea or vomiting.  Aggressive bowel regimen.  I discussed with RN over the weekend and today to advance tube feeds as tolerated.  I discussed with patient in the presence of RN this morning and patient categorically refused gastric tube insertion.  Chronic hypoxic respiratory failure/tracheostomy and vent dependent: Management per CCM, input appreciated.  Discussed with CCM.  Stable.  Paroxysmal A. fib: Currently in sinus rhythm.  Continue amiodarone, metoprolol as blood pressure tolerates and aspirin.  Not on anticoagulation ? reason.  COPD: Stable without  clinical bronchospasm.  Gout: Without acute flare.   DVT prophylaxis: SCDs & subcutaneous heparin Code Status: Full Family Communication: None at bedside Disposition: Possible DC to [facility]  9/10 pending stability overnight.   Consultants:  CCM  Procedures:  Patient has tracheostomy and chronic vent. Foley catheter changed this admission. NG tube.  Antimicrobials:  Vancomycin and cefepime, discontinued. IV ceftriaxone 9/8 >  Subjective: States that he feels so-so.  Ongoing abdominal gaseous distention.  Had BM yesterday.  No abdominal pain.  Refuses consideration for gastric tube.  ROS: As above, otherwise unable or negative.  Objective:  Vitals:   08/23/18 1524 08/23/18 1600 08/23/18 1700 08/23/18 1715  BP:    (!) 130/105  Pulse: (!) 50 66 (!) 57 (!) 58  Resp: 18 15 (!) 23 17  Temp: 98.8 F (37.1 C)     TempSrc: Oral     SpO2: 99% 100% 100% 100%  Weight:      Height:        Examination: No significant change in clinical exam compared to yesterday.  General exam: Pleasant middle-aged male, moderately built, morbidly obese, lying comfortably supine in bed.  Stable. Respiratory system: Diminished breath sounds in the bases but otherwise clear to auscultation. Respiratory effort normal.  Stable without change. Cardiovascular system: S1 & S2 heard, RRR. No JVD, murmurs, rubs, gallops or clicks.  Trace bilateral ankle edema.  Telemetry personally reviewed: SB in the 50s-occasionally in the 40s.  Asymptomatic. Gastrointestinal system: Abdomen is hugely distended, soft, nontender.  Not tense.  Lower midline surgical scar.  Upper midline ventral hernia.  Organomegaly or masses difficult to appreciate.  Tingling bowel sounds heard, possibly chronic.  Stable without change. Central nervous system: Alert and oriented. No focal neurological deficits.  Follows instructions appropriately Extremities: Symmetric 5 x 5 power. Skin: No rashes, lesions or ulcers Psychiatry: Judgement and insight appear normal. Mood & affect appropriate.     Data Reviewed: I have personally reviewed following labs and imaging studies  CBC: Recent Labs  Lab 08/19/18 1907 08/20/18 0237  08/21/18 0443 08/22/18 0351  WBC 12.9* 23.4* 10.5 7.6  NEUTROABS 12.2*  --   --   --   HGB 11.0* 9.5* 8.9* 9.4*  HCT 35.5* 30.7* 28.8* 30.6*  MCV 88.1 88.5 87.8 88.2  PLT 298 283 254 017   Basic Metabolic Panel: Recent Labs  Lab 08/20/18 0237 08/20/18 0748 08/20/18 1216 08/21/18 0443 08/22/18 0351 08/23/18 0338  NA 142  --  142 144 144 144  K 3.4*  --  3.5 3.4* 3.4* 3.5  CL 107  --  110 113* 113* 112*  CO2 24  --  23 21* 22 23  GLUCOSE 101*  --  84 92 119* 95  BUN 17  --  _0 CREATININE 1.74*  1.73*  --  1.61* 1.54* 1.33* 1.30*  CALCIUM 8.3*  --  8.4* 8.3* 8.6* 8.6*  MG  --  1.8  --   --   --   --    Liver Function Tests: Recent Labs  Lab 08/19/18 1907 08/20/18 0237 08/21/18 0443  AST _1 ALT _2 ALKPHOS 213* 175* 134*  BILITOT 0.9 1.0 0.7  PROT 7.1 6.5 6.4*  ALBUMIN 2.9* 2.6* 2.5*   Coagulation Profile: Recent Labs  Lab 08/19/18 1907  INR 1.08   Cardiac Enzymes: Recent Labs  Lab 08/20/18 0237 08/20/18 0748 08/20/18 1424  TROPONINI 0.05* 0.04* 0.03*   HbA1C:  No results for input(s): HGBA1C in the last 72 hours. CBG: Recent Labs  Lab 08/20/18 0329 08/22/18 1954 08/23/18 1552  GLUCAP 89 98 105*    Recent Results (from the past 240 hour(s))  Culture, blood (Routine x 2)     Status: None (Preliminary result)   Collection Time: 08/19/18  7:07 PM  Result Value Ref Range Status   Specimen Description SITE NOT SPECIFIED  Final   Special Requests   Final    BOTTLES DRAWN AEROBIC AND ANAEROBIC Blood Culture adequate volume   Culture   Final    NO GROWTH 4 DAYS Performed at [facility], [address], [place], [place] [address]    Report Status PENDING  Incomplete  Culture, blood (Routine x 2)     Status: Abnormal   Collection Time: 08/19/18  7:13 PM  Result Value Ref Range Status   Specimen Description SITE NOT SPECIFIED  Final   Special Requests   Final    BOTTLES DRAWN AEROBIC AND ANAEROBIC Blood Culture adequate  volume   Culture  Setup Time   Final    GRAM POSITIVE COCCI IN BOTH AEROBIC AND ANAEROBIC BOTTLES CRITICAL RESULT CALLED TO, READ BACK BY AND VERIFIED WITH: PHARMD J CARNEY 08/20/18 AT [id number] CM    Culture (A)  Final    STAPHYLOCOCCUS SPECIES (COAGULASE NEGATIVE) THE SIGNIFICANCE OF ISOLATING THIS ORGANISM FROM A SINGLE SET OF BLOOD CULTURES WHEN MULTIPLE SETS ARE DRAWN IS UNCERTAIN. PLEASE NOTIFY THE MICROBIOLOGY DEPARTMENT WITHIN ONE WEEK IF SPECIATION AND SENSITIVITIES ARE REQUIRED. Performed at [facility], [place] [address] [place], [place] [address]    Report Status 08/22/2018 FINAL  Final  Blood Culture ID Panel (Reflexed)     Status: None   Collection Time: 08/19/18  7:13 PM  Result Value Ref Range Status   Enterococcus species NOT DETECTED NOT DETECTED Final   Listeria monocytogenes NOT DETECTED NOT DETECTED Final   Staphylococcus species NOT DETECTED NOT DETECTED Final   Staphylococcus aureus NOT DETECTED NOT DETECTED Final   Streptococcus species NOT DETECTED NOT DETECTED Final   Streptococcus agalactiae NOT DETECTED NOT DETECTED Final   Streptococcus pneumoniae NOT DETECTED NOT DETECTED Final   Streptococcus pyogenes NOT DETECTED NOT DETECTED Final   Acinetobacter baumannii NOT DETECTED NOT DETECTED Final   Enterobacteriaceae species NOT DETECTED NOT DETECTED Final   Enterobacter cloacae complex NOT DETECTED NOT DETECTED Final   Escherichia coli NOT DETECTED NOT DETECTED Final   Klebsiella oxytoca NOT DETECTED NOT DETECTED Final   Klebsiella pneumoniae NOT DETECTED NOT DETECTED Final   Proteus species NOT DETECTED NOT DETECTED Final   Serratia marcescens NOT DETECTED NOT DETECTED Final   Haemophilus influenzae NOT DETECTED NOT DETECTED Final   Neisseria meningitidis NOT DETECTED NOT DETECTED Final   Pseudomonas aeruginosa NOT DETECTED NOT DETECTED Final   Candida albicans NOT DETECTED NOT DETECTED Final   Candida glabrata NOT DETECTED NOT DETECTED Final   Candida  krusei NOT DETECTED NOT DETECTED Final   Candida parapsilosis NOT DETECTED NOT DETECTED Final   Candida tropicalis NOT DETECTED NOT DETECTED Final    Comment: Performed at [facility], [place] [address], [place], [place] [address]  Urine culture     Status: Abnormal   Collection Time: 08/19/18  7:19 PM  Result Value Ref Range Status   Specimen Description URINE, CATHETERIZED  Final   Special Requests   Final    NONE Performed at [facility], [place]  [address], [place], [place] [address]    Culture (A)  Final    >=100,000 COLONIES/mL PROVIDENCIA STUARTII >=100,000 COLONIES/mL ESCHERICHIA COLI    Report Status 08/22/2018 FINAL  Final   Organism ID, Bacteria PROVIDENCIA STUARTII (A)  Final   Organism ID, Bacteria ESCHERICHIA COLI (A)  Final      Susceptibility   Escherichia coli - MIC*    AMPICILLIN >=32 RESISTANT Resistant     CEFAZOLIN <=4 SENSITIVE Sensitive     CEFTRIAXONE <=1 SENSITIVE Sensitive     CIPROFLOXACIN >=4 RESISTANT Resistant     GENTAMICIN <=1 SENSITIVE Sensitive     IMIPENEM <=0.25 SENSITIVE Sensitive     NITROFURANTOIN <=16 SENSITIVE Sensitive     TRIMETH/SULFA >=320 RESISTANT Resistant     AMPICILLIN/SULBACTAM >=32 RESISTANT Resistant     PIP/TAZO 8 SENSITIVE Sensitive     Extended ESBL NEGATIVE Sensitive     * >=100,000 COLONIES/mL ESCHERICHIA COLI   Providencia stuartii - MIC*    AMPICILLIN RESISTANT Resistant     CEFAZOLIN >=64 RESISTANT Resistant     CEFTRIAXONE <=1 SENSITIVE Sensitive     CIPROFLOXACIN >=4 RESISTANT Resistant     GENTAMICIN RESISTANT Resistant     IMIPENEM 1 SENSITIVE Sensitive     NITROFURANTOIN >=512 RESISTANT Resistant     TRIMETH/SULFA <=20 SENSITIVE Sensitive     AMPICILLIN/SULBACTAM 4 SENSITIVE Sensitive     PIP/TAZO <=4 SENSITIVE Sensitive     * >=100,000 COLONIES/mL PROVIDENCIA STUARTII  MRSA PCR Screening     Status: None   Collection Time: 08/20/18  1:55 AM  Result Value Ref Range Status   MRSA by PCR NEGATIVE  NEGATIVE Final    Comment:        The GeneXpert MRSA Assay (FDA approved for NASAL specimens only), is one component of a comprehensive MRSA colonization surveillance program. It is not intended to diagnose MRSA infection nor to guide or monitor treatment for MRSA infections. Performed at [facility], [place] [address] [place], [place] [address]          Radiology Studies: No results found.      Scheduled Meds: . allopurinol  100 mg Per Tube QPM  . amiodarone  200 mg Per Tube Daily  . aspirin  81 mg Oral Daily  . chlorhexidine gluconate (MEDLINE KIT)  15 mL Mouth Rinse BID  . docusate  100 mg Per Tube BID  . feeding supplement (PRO-STAT SUGAR FREE 64)  60 mL Per Tube BID  . feeding supplement (VITAL HIGH PROTEIN)  1,000 mL Per Tube Q24H  . fluticasone  2 spray Each Nare Daily  . heparin  5,000 Units Subcutaneous Q8H  . [START ON 08/24/2018] Influenza vac split quadrivalent PF  0.5 mL Intramuscular Tomorrow-1000  . ipratropium-albuterol  3 mL Nebulization Q6H  . mouth rinse  15 mL Mouth Rinse 10 times per day  . Melatonin  3 mg Per Tube QHS  . metoprolol tartrate  25 mg Per Tube BID  . multivitamin  15 mL Oral Daily  . pantoprazole sodium  40 mg Per Tube Daily  . [START ON 08/24/2018] pneumococcal 23 valent vaccine  0.5 mL Intramuscular Tomorrow-1000  . polyethylene glycol  17 g Per Tube BID  . potassium chloride  40 mEq Per Tube Daily  . sennosides  5 mL Per Tube QHS  . simethicone  80 mg Per Tube QID   Continuous Infusions: . cefTRIAXone (ROCEPHIN)  IV 1 g (08/23/18 1733)     LOS:  3 days     Vernell Leep, MD, FACP, [facility]. [facility] Pager [phone]  If 7PM-7AM, please contact night-coverage [email or web address] Password [facility] 08/23/2018, 6:22 PM

## 2018-08-23 NOTE — Progress Notes (Signed)
Alex Reilly  ZOX:[id number] DOB: 1951-04-29 DOA: 08/19/2018 PCP: Sherron Monday, MD    LOS: 3 days   Reason for Consult / Chief Complaint:  Chronic Vent Management/ fever and hypotension  Consulting MD and date of consult:  Dr. Fredderick Phenix 9/5  HPI / Summary of Hospital Stay:  67 year old male from [facility] with PMH significant for chronic respiratory failure, s/p trach and ventilator dependence, COPD, PAF, CKD, HTN, chronic ileus with chronic NGT tube, and chronic indwelling foley catheter sent for evaluation of fever and hypotension.  Some reports of NGT backing up and vomiting but patient denies any acute abdominal pain or nausea.  Patient has chronic NGT / has refused PEG placement in the past as well as colon resection.  CXR without evidence of acute event.  ABG normal on current ventilator settings.  He was given 30 ml/kg bolus with resolution of hypotension and empirically started on vancomycin, cefepime, and flagyl.  Workup showing UTI.  Normal lactate.  He has remained alert and able to communicate at his baseline and hemodynamically stable, therefore will be admitted by [facility], and PCCM will consult for chronic ventilator management. Patient treated for providencia & E-Coli UTI.  Antibiotics narrowed based on cultures.    Subjective  RN reports no acute respiratory issues. Pt denies nausea/vomiting, shortness of breath.    Assessment & Plan:  Chronic respiratory failure, chronic trach and MV dependence Aspiration risk COPD - no current evidence of acute respiratory issue P:  Continue PCV 18/5, 40% Trach care per protocol  SLP to continue efforts with in line speaking valve  Duoneb Q6 & PRN albuterol  Follow intermittent CXR  Aspiration precautions Advance TF as tolerated per primary PPI for SUP  All other medical issues per [facility]  Best Practice / Goals of Care / Disposition.   DVT prophylaxis: heparin SQ GI prophylaxis: Pepcid  Diet:  TF Mobility: bedrest,  advance as able Code Status: Full  Family Communication: Patient updated on plan of care, no family at bedside.  Disposition:  Ok to return to [facility] from pulmonary standpoint when ok with medicine.   Disposition / Summary of Today's Plan 08/23/18   SDU status, care per [facility].  PCCM managing vent.    Consultants: date of consult/date signed off (if applicable)/final recs  [facility] primary team  Procedures: 9/5 Foley catheter repositioned  Significant Diagnostic Tests: 9/5 CT abd/pelvis >> Unchanged marked dilatation of the colon again noted (primarily transverse) containing gas and some fluid without obstructing cause. This is unchanged from 05/06/2018 likely representing a chronic ileus. No evidence of small bowel obstruction, abscess or pneumoperitoneum. No acute abnormalities identified. Cardiomegaly, coronary artery disease and unchanged mild bibasilar atelectasis. Aortic Atherosclerosis   Micro Data: 9/6 MRSA PCR >> negative 9/6 UC >> providencia stuartii >> S- rocephin                   E-coli >> S- rocephin 9/6 BCx 2 >> 1/2 with coag neg staff, likely contaminant   Antimicrobials:  9/5 vancomycin >> 9/6 9/5 flagyl >> 9/6 9/5 cefepime >> 9/6 Rocephin 9/6 >>   Objective    Examination: General: obese adult male in NAD lying in bed  HEENT: MM pink/moist, #6 proximal XLT trach midline c/d/i  Neuro: Awake, alert, follows commands, MAE/generalized weakness  CV: s1s2 rrr, no m/r/g PULM: even/non-labored, lungs bilaterally clear GI: protuberant, decreased bowel sounds, non-tender Extremities: warm/dry, trace generalized edema  Skin: no rashes or lesions  Blood pressure 128/78, pulse (!) 48, temperature 98 F (36.7 C), temperature source Axillary, resp. rate 20, height 5\' 5"  (1.651 m), weight (!) 151.1 kg, SpO2 100 %.    Vent Mode: PCV FiO2 (%):  [40 %] 40 % Set Rate:  [18 bmp] 18 bmp PEEP:  [5 cmH20] 5 cmH20 Plateau Pressure:  [14 cmH20-17 cmH20] 15 cmH20   Intake/Output  Summary (Last 24 hours) at 08/23/2018 0756 Last data filed at 08/23/2018 0600 Gross per 24 hour  Intake 540 ml  Output 475 ml  Net 65 ml   Filed Weights   08/21/18 0500 08/22/18 0500 08/23/18 0500  Weight: (!) 150.9 kg (!) 149.9 kg (!) 151.1 kg     Labs    CBC: Recent Labs  Lab 08/19/18 1907 08/20/18 0237 08/21/18 0443 08/22/18 0351  WBC 12.9* 23.4* 10.5 7.6  NEUTROABS 12.2*  --   --   --   HGB 11.0* 9.5* 8.9* 9.4*  HCT 35.5* 30.7* 28.8* 30.6*  MCV 88.1 88.5 87.8 88.2  PLT 298 283 254 266   Basic Metabolic Panel: Recent Labs  Lab 08/20/18 0237 08/20/18 0748 08/20/18 1216 08/21/18 0443 08/22/18 0351 08/23/18 0338  NA 142  --  142 144 144 144  K 3.4*  --  3.5 3.4* 3.4* 3.5  CL 107  --  110 113* 113* 112*  CO2 24  --  23 21* 22 23  GLUCOSE 101*  --  84 92 119* 95  BUN 17  --  18 15 12 9   CREATININE 1.74*  1.73*  --  1.61* 1.54* 1.33* 1.30*  CALCIUM 8.3*  --  8.4* 8.3* 8.6* 8.6*  MG  --  1.8  --   --   --   --    GFR: Estimated Creatinine Clearance: 75.9 mL/min (A) (by C-G formula based on SCr of 1.3 mg/dL (H)). Recent Labs  Lab 08/19/18 1907 08/19/18 1929 08/19/18 2311 08/20/18 0237 08/20/18 0249 08/21/18 0443 08/22/18 0351  PROCALCITON  --   --   --   --  61.68 56.23 31.31  WBC 12.9*  --   --  23.4*  --  10.5 7.6  LATICACIDVEN  --  1.60 1.17  --   --   --   --    Liver Function Tests: Recent Labs  Lab 08/19/18 1907 08/20/18 0237 08/21/18 0443  AST 29 25 21   ALT 29 26 26   ALKPHOS 213* 175* 134*  BILITOT 0.9 1.0 0.7  PROT 7.1 6.5 6.4*  ALBUMIN 2.9* 2.6* 2.5*   No results for input(s): LIPASE, AMYLASE in the last 168 hours. No results for input(s): AMMONIA in the last 168 hours. ABG    Component Value Date/Time   PHART 7.435 08/19/2018 1958   PCO2ART 38.9 08/19/2018 1958   PO2ART 404.0 (H) 08/19/2018 1958   HCO3 25.6 08/19/2018 1958   TCO2 27 08/19/2018 1958   O2SAT 100.0 08/19/2018 1958    Coagulation Profile: Recent Labs  Lab  08/19/18 1907  INR 1.08   Cardiac Enzymes: Recent Labs  Lab 08/20/18 0237 08/20/18 0748 08/20/18 1424  TROPONINI 0.05* 0.04* 0.03*   HbA1C: No results found for: HGBA1C   CBG: Recent Labs  Lab 08/20/18 0329 08/22/18 1954  GLUCAP 89 98     Canary Brim, NP-C [facility] Pgr: [phone] or if no answer [phone] 08/23/2018, 7:56 AM

## 2018-08-23 NOTE — Progress Notes (Signed)
Nutrition Follow-up  DOCUMENTATION CODES:   Morbid obesity  INTERVENTION:   Tube Feeding:  Continue Vital High Protein @ 45 ml/hr Continue Pro-Stat 60 mL BID Provides 1480 kcals, 155 g of protein and 1243 mL of free water  Add MVI   NUTRITION DIAGNOSIS:   Inadequate oral intake related to inability to eat as evidenced by NPO status.  Being addressed via TF  GOAL:   Provide needs based on ASPEN/SCCM guidelines  Progressing  MONITOR:   Vent status, Labs, Weight trends, Skin, I & O's  REASON FOR ASSESSMENT:   Ventilator    ASSESSMENT:   67 year old male who presented to the ED from [facility] with new onset fever. PMH significant for chronic respiratory failure requiring chronic trach on vent, chronic ileus, COPD, GERD, CKD stage 3, hypertension, and atrial fibrillation. Pt receives feedings by and NG tube. Per [facility], a G-tube was not placed due to pt's ileus. Pt admitted with sepsis due to UTI.  PMV x 30 minutes daily; chronic vent support +Chronic ileus, continuing to feed as able.    TF increased to Vital High Protein @ 45 ml/hr this AM. Tolerated trickle TF. Tolerating increase in rate per RN.   Labs: reviewed Meds: reviewed   Diet Order:   Diet Order            Diet NPO time specified  Diet effective now              EDUCATION NEEDS:   No education needs have been identified at this time  Skin:  Skin Assessment: Reviewed RN Assessment(skin tear to buttocks)  Last BM:  9/9  Height:   Ht Readings from Last 1 Encounters:  08/20/18 5\' 5"  (1.651 m)    Weight:   Wt Readings from Last 1 Encounters:  08/23/18 (!) 151.1 kg    Ideal Body Weight:  61.82 kg  BMI:  Body mass index is 55.43 kg/m.  Estimated Nutritional Needs:   Kcal:  5916-3846  Protein:  >/ 155 grams  Fluid:  >/= 1.6 L  Romelle Starcher MS, RD, LDN, CNSC [phone] Pager  [phone] Weekend/On-Call Pager

## 2018-08-23 NOTE — Progress Notes (Signed)
CSW spoke with [facility]. Confirmed that pt is from [facility]. If pt is medically cleared, pt can return to [facility] tomorrow.   CSW will leave handoff for daytime CSW to follow up with [facility] admissions, [phone] and ask for [name]. Admissions has gone home at this time.   Montine Circle, [facility]  [phone]

## 2018-08-23 NOTE — Progress Notes (Signed)
  Speech Language Pathology Treatment: Hillary Bow Speaking valve  Patient Details Name: Alex Reilly MRN: [id number] DOB: 1951/11/04 Today's Date: 08/23/2018 Time: 7078-6754 SLP Time Calculation (min) (ACUTE ONLY): 41 min  Assessment / Plan / Recommendation Clinical Impression  Pt was seen for inline PMV placement with the assistance of RT, who slowly deflated the cuff and manipulated vent alarm settings. Appropriate drop in VTe was noted prior to PMV placement, and VS remains stable throughout use for approximately 35 minutes. Pt tends to speak at a rapid rate, needing Min cues for repetition at the conversational level as intelligibility begins to decrease. Pt was able to discuss POC and GOC with his nurse. We also discussed communication methods when pt is not able to have the PMV in place. He declined use of low tech AAC (writing, picture boards, letter boards, etc.), stating that he would prefer to use mouthing and gestures. Recommend continuing to utilize PMV while inpatient as supervision can be provided with RT. SLP will follow to facilitate communication and intelligibility. Of note, pt is eager for return to PO diet, although per chart there is also concern for chronic ileus. If pt is medically appropriate to attempt POs, could consider swallow evaluation either acutely or upon return to [facility].   HPI HPI: 67 year old male transferred to [facility] from [facility], PMH significant for chronic hypoxic respiratory failure, status post tracheostomy and ventilator dependent, COPD, PAF, CKD stage III, HTN, chronic ileus with chronic NG tube feeding, chronic indwelling Foley catheter presented with fever and hypotension.  Admitted to stepdown unit for sepsis secondary to CAUTI.  Foley catheter was not in the appropriate position, was changed.  Per notes, pt was participating in PMV trials.       SLP Plan  Continue with current plan of care       Recommendations         Patient may use  Passy-Muir Speech Valve: (with RT and SLP) PMSV Supervision: Full         Follow up Recommendations: LTACH SLP Visit Diagnosis: Aphonia (R49.1) Plan: Continue with current plan of care       GO                Maxcine Ham 08/23/2018, 11:05 AM  Maxcine Ham, M.A. CCC-SLP Acute Herbalist [phone] Office [phone]

## 2018-08-23 NOTE — Progress Notes (Signed)
Patient had PMV trial for about 30 minutes. Speech and RN at bedside. Patient tolerated well.

## 2018-08-24 LAB — BASIC METABOLIC PANEL
Anion gap: 8 (ref 5–15)
BUN: 15 mg/dL (ref 8–23)
CALCIUM: 8.6 mg/dL — AB (ref 8.9–10.3)
CHLORIDE: 112 mmol/L — AB (ref 98–111)
CO2: 21 mmol/L — AB (ref 22–32)
Creatinine, Ser: 1.2 mg/dL (ref 0.61–1.24)
GFR calc non Af Amer: >60 mL/min (ref >60)
GLUCOSE: 98 mg/dL (ref 70–99)
POTASSIUM: 3.8 mmol/L (ref 3.5–5.1)
Sodium: 141 mmol/L (ref 135–145)

## 2018-08-24 LAB — CULTURE, BLOOD (ROUTINE X 2)
CULTURE: NO GROWTH
SPECIAL REQUESTS: ADEQUATE

## 2018-08-24 NOTE — Progress Notes (Signed)
PROGRESS NOTE   Alex Reilly  OZH:[id number]    DOB: 03/15/1951    DOA: 08/19/2018  PCP: [name], MD   I have briefly reviewed patients previous medical records in [facility].  Brief Narrative:  67 year old male transferred to [facility] from [facility], [place] significant for chronic hypoxic respiratory failure, status post tracheostomy and ventilator dependent, COPD, PAF, CKD stage III, HTN, chronic ileus with chronic NG tube feeding, chronic indwelling Foley catheter presented with fever and hypotension.  Admitted to stepdown unit for sepsis secondary to CAUTI.  Foley catheter was not in the appropriate position, was changed.  CCM consulted for vent management.  Slowly improving.  Possible DC to SNF 08/25/2018.   Assessment & Plan:   Principal Problem:   Sepsis ([place]) Active Problems:   Urinary tract infection associated with indwelling urethral catheter (HCC)   Anemia   Renal insufficiency   COPD (chronic obstructive pulmonary disease) (HCC)   Chronic respiratory failure with hypoxia (HCC)   Tracheostomy status ([place])   Sepsis due to suspected CAUTI: Patient met sepsis criteria on admission.  Blood cultures x2: Negative to date.  Urine culture pending.  MRSA PCR negative.  CT abdomen shows chronic ileus but no other acute abnormalities.  Chest x-ray shows chronic diaphragm elevation, low inspiratory volumes and atelectasis.  Treated per sepsis protocol with IV fluids and IV vancomycin and cefepime. Urine cultures show >100,000 colonies per mL of Provedentia stuartii & E. coli, both sensitive to ceftriaxone.  DC cefepime and start IV ceftriaxone and plan 1 week of antibiotics.  1 of 2 blood cultures shows coagulase-negative staph, BCID negative, likely contaminant.  Discontinued vancomycin.  Sepsis resolved.?  Day 6 of IV antibiotics, continue while inpatient.  At discharge, transitioned to cefixime 400 mg orally daily through 08/28/2018 (discussed with pharmacy).  Patient at  risk for recurrent UTI due to indwelling Foley catheter.  Urinary retention/chronic indwelling Foley catheter: Foley catheter tip and balloon were within the urethra.  Foley catheter was changed this admission.  Acute on chronic kidney disease: Baseline creatinine not exactly known.  Had creatinine of 1.95 in July 2019.  Initial creatinine this admission was 1.58 which increased to 1.73.  Likely related to sepsis, dehydration and poorly functioning Foley catheter.  Creatinine gradually improved and down to 1.2 on 9/10.  Hypokalemia: Replaced.  BMP 1.8.  Anemia: Possibly chronic disease.  No bleeding reported.  Hemoglobin is gradually dropped from 11-8.9 since admission.  No overt bleeding.  Some of this could be dilutional versus acute illness.  Hemoglobin up to 9.4.  Leukocytosis: Secondary to sepsis.  Resolved.  Elevated troponin: No chest pain reported.  Flat trend.  Likely demand from sepsis.  Chronic ileus: Reported reason for not having G-tube.  Abdomen markedly distended.  Reports of patient refusing G-tube at [facility].  Tube feeds advanced to goal and tolerating same.  Patient has continued to refuse G-tube placement.  Consider GI consultation at SNF regarding promotility agents.  Chronic hypoxic respiratory failure/tracheostomy and vent dependent: Management per CCM.  Stable.  Patient has been tolerating PMV valve.  Paroxysmal A. fib: Currently in sinus rhythm.  Continue amiodarone, metoprolol as blood pressure tolerates and aspirin.  Not on anticoagulation ? reason.  COPD: Stable without clinical bronchospasm.  Gout: Without acute flare.   DVT prophylaxis: SCDs & subcutaneous heparin Code Status: Full Family Communication: None at bedside Disposition: DC likely to [facility] SN[facility] (where he was prior to this admission) on 08/25/2018 when bed will  be available for him as per clinical social work.   Consultants:  CCM  Procedures:  Patient has tracheostomy and chronic vent. Foley  catheter changed this admission. NG tube.  Antimicrobials:  Vancomycin and cefepime, discontinued. IV ceftriaxone 9/8 >  Subjective: Patient interviewed and examined along with RN this morning.  States that he had a BM yesterday.  Denies any other significant complaints.  No dyspnea, chest pain.  Chronic abdominal distention without pain.  Tolerating tube feeds.  Patient declined my offer to speak with his family to update care.  ROS: As above, otherwise unable or negative.  Objective:  Vitals:   08/24/18 1500 08/24/18 1518 08/24/18 1600 08/24/18 1700  BP: 123/64 123/64 (!) 120/107 132/70  Pulse: 62 62 62 74  Resp: (!) 23 (!) 24 (!) 21 (!) 22  Temp:      TempSrc:      SpO2: 96% 97% 99% 97%  Weight:      Height:        Examination:   General exam: Pleasant middle-aged male, moderately built, morbidly obese, lying comfortably supine in bed.  Stable Respiratory system: Diminished breath sounds in the bases but otherwise clear to auscultation. Respiratory effort normal.  Stable Cardiovascular system: S1 & S2 heard, RRR. No JVD, murmurs, rubs, gallops or clicks.  Trace bilateral ankle edema.  Telemetry personally reviewed: SB in the 2s.  Occasional SB in the 40s, asymptomatic.  SR. Gastrointestinal system: Abdomen is hugely distended, soft, nontender.  Not tense.  Lower midline surgical scar.  Upper midline ventral hernia.  Organomegaly or masses difficult to appreciate.  Tingling bowel sounds heard, chronic.  Stable. Central nervous system: Alert and oriented. No focal neurological deficits.  Follows instructions appropriately Extremities: Symmetric 5 x 5 power. Skin: No rashes, lesions or ulcers Psychiatry: Judgement and insight appear normal. Mood & affect appropriate.     Data Reviewed: I have personally reviewed following labs and imaging studies  CBC: Recent Labs  Lab 08/19/18 1907 08/20/18 0237 08/21/18 0443 08/22/18 0351  WBC 12.9* 23.4* 10.5 7.6  NEUTROABS 12.2*   --   --   --   HGB 11.0* 9.5* 8.9* 9.4*  HCT 35.5* 30.7* 28.8* 30.6*  MCV 88.1 88.5 87.8 88.2  PLT 298 283 254 161   Basic Metabolic Panel: Recent Labs  Lab 08/20/18 0748 08/20/18 1216 08/21/18 0443 08/22/18 0351 08/23/18 0338 08/24/18 0525  NA  --  142 144 144 144 141  K  --  3.5 3.4* 3.4* 3.5 3.8  CL  --  110 113* 113* 112* 112*  CO2  --  23 21* 22 23 21*  GLUCOSE  --  84 92 119* 95 98  BUN  --  18 15 12 9 15   CREATININE  --  1.61* 1.54* 1.33* 1.30* 1.20  CALCIUM  --  8.4* 8.3* 8.6* 8.6* 8.6*  MG 1.8  --   --   --   --   --    Liver Function Tests: Recent Labs  Lab 08/19/18 1907 08/20/18 0237 08/21/18 0443  AST 29 25 21   ALT 29 26 26   ALKPHOS 213* 175* 134*  BILITOT 0.9 1.0 0.7  PROT 7.1 6.5 6.4*  ALBUMIN 2.9* 2.6* 2.5*   Coagulation Profile: Recent Labs  Lab 08/19/18 1907  INR 1.08   Cardiac Enzymes: Recent Labs  Lab 08/20/18 0237 08/20/18 0748 08/20/18 1424  TROPONINI 0.05* 0.04* 0.03*   HbA1C: No results for input(s): HGBA1C in the last 72 hours. CBG:  Recent Labs  Lab 08/20/18 0329 08/22/18 1954 08/23/18 1552  GLUCAP 89 98 105*    Recent Results (from the past 240 hour(s))  Culture, blood (Routine x 2)     Status: None   Collection Time: 08/19/18  7:07 PM  Result Value Ref Range Status   Specimen Description SITE NOT SPECIFIED  Final   Special Requests   Final    BOTTLES DRAWN AEROBIC AND ANAEROBIC Blood Culture adequate volume   Culture   Final    NO GROWTH 5 DAYS Performed at [facility], [address] [place], [place] [address]    Report Status 08/24/2018 FINAL  Final  Culture, blood (Routine x 2)     Status: Abnormal   Collection Time: 08/19/18  7:13 PM  Result Value Ref Range Status   Specimen Description SITE NOT SPECIFIED  Final   Special Requests   Final    BOTTLES DRAWN AEROBIC AND ANAEROBIC Blood Culture adequate volume   Culture  Setup Time   Final    GRAM POSITIVE COCCI IN BOTH AEROBIC AND ANAEROBIC  BOTTLES CRITICAL RESULT CALLED TO, READ BACK BY AND VERIFIED WITH: PHARMD J CARNEY 08/20/18 AT 0865HQ CM    Culture (A)  Final    STAPHYLOCOCCUS SPECIES (COAGULASE NEGATIVE) THE SIGNIFICANCE OF ISOLATING THIS ORGANISM FROM A SINGLE SET OF BLOOD CULTURES WHEN MULTIPLE SETS ARE DRAWN IS UNCERTAIN. PLEASE NOTIFY THE MICROBIOLOGY DEPARTMENT WITHIN ONE WEEK IF SPECIATION AND SENSITIVITIES ARE REQUIRED. Performed at [facility], [place] [address], [place], [place] [id number]    Report Status 08/22/2018 FINAL  Final  Blood Culture ID Panel (Reflexed)     Status: None   Collection Time: 08/19/18  7:13 PM  Result Value Ref Range Status   Enterococcus species NOT DETECTED NOT DETECTED Final   Listeria monocytogenes NOT DETECTED NOT DETECTED Final   Staphylococcus species NOT DETECTED NOT DETECTED Final   Staphylococcus aureus NOT DETECTED NOT DETECTED Final   Streptococcus species NOT DETECTED NOT DETECTED Final   Streptococcus agalactiae NOT DETECTED NOT DETECTED Final   Streptococcus pneumoniae NOT DETECTED NOT DETECTED Final   Streptococcus pyogenes NOT DETECTED NOT DETECTED Final   Acinetobacter baumannii NOT DETECTED NOT DETECTED Final   Enterobacteriaceae species NOT DETECTED NOT DETECTED Final   Enterobacter cloacae complex NOT DETECTED NOT DETECTED Final   Escherichia coli NOT DETECTED NOT DETECTED Final   Klebsiella oxytoca NOT DETECTED NOT DETECTED Final   Klebsiella pneumoniae NOT DETECTED NOT DETECTED Final   Proteus species NOT DETECTED NOT DETECTED Final   Serratia marcescens NOT DETECTED NOT DETECTED Final   Haemophilus influenzae NOT DETECTED NOT DETECTED Final   Neisseria meningitidis NOT DETECTED NOT DETECTED Final   Pseudomonas aeruginosa NOT DETECTED NOT DETECTED Final   Candida albicans NOT DETECTED NOT DETECTED Final   Candida glabrata NOT DETECTED NOT DETECTED Final   Candida krusei NOT DETECTED NOT DETECTED Final   Candida parapsilosis NOT DETECTED NOT DETECTED Final    Candida tropicalis NOT DETECTED NOT DETECTED Final    Comment: Performed at [facility], [place] [address], [place], [place] [address]  Urine culture     Status: Abnormal   Collection Time: 08/19/18  7:19 PM  Result Value Ref Range Status   Specimen Description URINE, CATHETERIZED  Final   Special Requests   Final    NONE Performed at [facility], [place] [address] [place],  [address]    Culture (A)  Final    >=  100,000 COLONIES/mL PROVIDENCIA STUARTII >=100,000 COLONIES/mL ESCHERICHIA COLI    Report Status 08/22/2018 FINAL  Final   Organism ID, Bacteria PROVIDENCIA STUARTII (A)  Final   Organism ID, Bacteria ESCHERICHIA COLI (A)  Final      Susceptibility   Escherichia coli - MIC*    AMPICILLIN >=32 RESISTANT Resistant     CEFAZOLIN <=4 SENSITIVE Sensitive     CEFTRIAXONE <=1 SENSITIVE Sensitive     CIPROFLOXACIN >=4 RESISTANT Resistant     GENTAMICIN <=1 SENSITIVE Sensitive     IMIPENEM <=0.25 SENSITIVE Sensitive     NITROFURANTOIN <=16 SENSITIVE Sensitive     TRIMETH/SULFA >=320 RESISTANT Resistant     AMPICILLIN/SULBACTAM >=32 RESISTANT Resistant     PIP/TAZO 8 SENSITIVE Sensitive     Extended ESBL NEGATIVE Sensitive     * >=100,000 COLONIES/mL ESCHERICHIA COLI   Providencia stuartii - MIC*    AMPICILLIN RESISTANT Resistant     CEFAZOLIN >=64 RESISTANT Resistant     CEFTRIAXONE <=1 SENSITIVE Sensitive     CIPROFLOXACIN >=4 RESISTANT Resistant     GENTAMICIN RESISTANT Resistant     IMIPENEM 1 SENSITIVE Sensitive     NITROFURANTOIN >=512 RESISTANT Resistant     TRIMETH/SULFA <=20 SENSITIVE Sensitive     AMPICILLIN/SULBACTAM 4 SENSITIVE Sensitive     PIP/TAZO <=4 SENSITIVE Sensitive     * >=100,000 COLONIES/mL PROVIDENCIA STUARTII  MRSA PCR Screening     Status: None   Collection Time: 08/20/18  1:55 AM  Result Value Ref Range Status   MRSA by PCR NEGATIVE NEGATIVE Final    Comment:        The GeneXpert MRSA Assay (FDA approved for NASAL  specimens only), is one component of a comprehensive MRSA colonization surveillance program. It is not intended to diagnose MRSA infection nor to guide or monitor treatment for MRSA infections. Performed at [facility], [place] [address], [place], [place] [address]          Radiology Studies: No results found.      Scheduled Meds: . allopurinol  100 mg Per Tube QPM  . amiodarone  200 mg Per Tube Daily  . aspirin  81 mg Oral Daily  . chlorhexidine gluconate (MEDLINE KIT)  15 mL Mouth Rinse BID  . docusate  100 mg Per Tube BID  . feeding supplement (PRO-STAT SUGAR FREE 64)  60 mL Per Tube BID  . feeding supplement (VITAL HIGH PROTEIN)  1,000 mL Per Tube Q24H  . fluticasone  2 spray Each Nare Daily  . heparin  5,000 Units Subcutaneous Q8H  . ipratropium-albuterol  3 mL Nebulization Q6H  . mouth rinse  15 mL Mouth Rinse QID  . Melatonin  3 mg Per Tube QHS  . metoprolol tartrate  25 mg Per Tube BID  . multivitamin  15 mL Oral Daily  . pantoprazole sodium  40 mg Per Tube Daily  . polyethylene glycol  17 g Per Tube BID  . potassium chloride  40 mEq Per Tube Daily  . sennosides  5 mL Per Tube QHS  . simethicone  80 mg Per Tube QID   Continuous Infusions: . sodium chloride 10 mL/hr at 08/24/18 0400  . cefTRIAXone (ROCEPHIN)  IV 1 g (08/24/18 1700)     LOS: 4 days     Vernell Leep, MD, FACP, [facility]. [facility] Pager [phone]  If 7PM-7AM, please contact night-coverage [email or web address] Password [facility] 08/24/2018, 5:57 PM

## 2018-08-24 NOTE — Progress Notes (Signed)
  Speech Language Pathology Treatment: Hillary Bow Speaking valve  Patient Details Name: Alex Reilly MRN: [id number] DOB: 12/22/1950 Today's Date: 08/24/2018 Time: 5397-6734 SLP Time Calculation (min) (ACUTE ONLY): 19 min  Assessment / Plan / Recommendation Clinical Impression  Pt was seen with RT to facilitate inline PMV use. Cuff was deflated with adequate leak as evidenced by appropriate drop in VTe and pt ability to speak around trach. RT provided tracheal suction and pt self-performed oral and nasal suction. Pt's speech was audible and fluent, although not fully intelligible at the conversational level (almost ~75%). Of note, pt was animated and angry during conversation with family members; RN made aware. Pt tolerated PMV well for 10 minutes before being removed. Will continue to follow for trials as supervision can be provided.   HPI HPI: 67 year old male transferred to [facility] from [facility], PMH significant for chronic hypoxic respiratory failure, status post tracheostomy and ventilator dependent, COPD, PAF, CKD stage III, HTN, chronic ileus with chronic NG tube feeding, chronic indwelling Foley catheter presented with fever and hypotension.  Admitted to stepdown unit for sepsis secondary to CAUTI.  Foley catheter was not in the appropriate position, was changed.  Per notes, pt was participating in PMV trials.       SLP Plan  Continue with current plan of care       Recommendations         Patient may use Passy-Muir Speech Valve: (with SLP or RT) PMSV Supervision: Full         Follow up Recommendations: LTACH SLP Visit Diagnosis: Aphonia (R49.1) Plan: Continue with current plan of care       GO                Maxcine Ham 08/24/2018, 3:12 PM  Maxcine Ham, M.A. CCC-SLP Acute Herbalist [phone] Office [phone]

## 2018-08-24 NOTE — Progress Notes (Addendum)
1:45pm-CSW spoke with [name] from [facility]. CSW informed that they can take pt in the morning. CSW will email over discharge summary to [name] once it is in the system in the morning. CSW has sent other documents to [name] at this time.   10:24am-CSW spoke with [name] and was informed that they are unable to take pt today as they need to review information on pt and get a bed. CSW was asked to fax over updated information on pt. At this time, CSW has faxed over needed information to [facility] and updated RN on unit. CSW will continue to follow for further needs.   9:39am- CSW left voicemail for [name] admissions with [facility] regarding pt returning to facility once medically stable. CSW still following as CSW aware that staff is working to get pt's tube feeds at goal.   CSW aware that pt is ready to return back to [facility]. CSW reached out to [facility] Admission [name]. CSW made aware that she is not in the building yet. CSW to call back around 8:30am to see when pt can return to facility. CSW will continue to follow for further needs.    Claude Manges Magdalene Tardiff, MSW, LCSW-A Emergency Department Clinical Social Worker [phone]

## 2018-08-25 DIAGNOSIS — A4151 Sepsis due to Escherichia coli [E. coli]: Secondary | ICD-10-CM

## 2018-08-25 LAB — MAGNESIUM: MAGNESIUM: 1.8 mg/dL (ref 1.7–2.4)

## 2018-08-25 LAB — BASIC METABOLIC PANEL
Anion gap: 7 (ref 5–15)
BUN: 25 mg/dL — ABNORMAL HIGH (ref 8–23)
CALCIUM: 8.7 mg/dL — AB (ref 8.9–10.3)
CO2: 23 mmol/L (ref 22–32)
CREATININE: 1.57 mg/dL — AB (ref 0.61–1.24)
Chloride: 112 mmol/L — ABNORMAL HIGH (ref 98–111)
GFR, EST AFRICAN AMERICAN: 51 mL/min — AB (ref >60)
GFR, EST NON AFRICAN AMERICAN: 44 mL/min — AB (ref >60)
Glucose, Bld: 97 mg/dL (ref 70–99)
Potassium: 3.8 mmol/L (ref 3.5–5.1)
SODIUM: 142 mmol/L (ref 135–145)

## 2018-08-25 LAB — CBC
HEMATOCRIT: 31.5 % — AB (ref 39.0–52.0)
Hemoglobin: 9.7 g/dL — ABNORMAL LOW (ref 13.0–17.0)
MCH: 27.3 pg (ref 26.0–34.0)
MCHC: 30.8 g/dL (ref 30.0–36.0)
MCV: 88.7 fL (ref 78.0–100.0)
Platelets: 306 10*3/uL (ref 150–400)
RBC: 3.55 MIL/uL — ABNORMAL LOW (ref 4.22–5.81)
RDW: 17.3 % — AB (ref 11.5–15.5)
WBC: 10.9 10*3/uL — AB (ref 4.0–10.5)

## 2018-08-25 MED ORDER — METOCLOPRAMIDE HCL 5 MG/ML IJ SOLN
5.0000 mg | Freq: Three times a day (TID) | INTRAMUSCULAR | Status: DC
  Administered 2018-08-25 – 2018-08-26 (×3): 5 mg via INTRAVENOUS
  Filled 2018-08-25 (×4): qty 1

## 2018-08-25 NOTE — Progress Notes (Signed)
CSW spoke with MD and was informed that MD will possibly discharge pt tomorrow as pt has been running fevers. CSW updated [name] with [facility] at this time. CSw will follow for possible discharge on 08/26/18.   Alex Reilly, MSW, LCSW-A Emergency Department Clinical Social Worker [phone]

## 2018-08-25 NOTE — Progress Notes (Signed)
PROGRESS NOTE    Alex Reilly  XBD:[id number] DOB: [birth year] DOA: 08/19/2018 PCP: [name], MD    Brief Narrative: 67 year old male transferred to [facility] from [facility], [place] significant for chronic hypoxic respiratory failure, status post tracheostomy and ventilator dependent, COPD, PAF, CKD stage III, HTN, chronic ileus with chronic NG tube feeding, chronic indwelling Foley catheter presented with fever and hypotension.  Admitted to stepdown unit for sepsis secondary to CAUTI.  Foley catheter was not in the appropriate position, was changed.  CCM consulted for vent management.  Slowly improving.  Possible DC to SNF 08/25/2018.   Assessment & Plan:   Principal Problem:   Sepsis ([place]) Active Problems:   Urinary tract infection associated with indwelling urethral catheter (HCC)   Anemia   Renal insufficiency   COPD (chronic obstructive pulmonary disease) (HCC)   Chronic respiratory failure with hypoxia (HCC)   Tracheostomy status ([place])   1-Sepsis, due to CAUTI.  Blood culture one of two coagulase negative staph. Contaminant.  Urine culture grew 100,000 Provedentia and E coli.  Continue with ceftriaxone.  Spike fever last night. Continue with IV antibiotics. Continue overnight.  Day 7 antibiotics. Will need long course due to chronic foley catheter.  Urinary retention/chronic indwelling Foley catheter: Foley catheter tip and balloon were within the urethra.  Foley catheter was changed this admission.  Leukocytosis; increase WBC today. Continue with Iv antibiotics.  Anemia; follow trend. Hd drop from 11--8.9  Hb stable.  Monitor. Hemodilution.   Chronic Ileus;  Has NG tube in place.  Will consult GI for further recommendation.   PAF; on amiodarone, metoprolol.  Not on anticoagulation. Will defer to PCP.    COPD: Stable without clinical bronchospasm.  Gout: Without acute flare.      DVT prophylaxis: Heparin. Code Status: full code.  Family  Communication: care discussed with patient.  [facility]position Plan[facility].  Consultants:   CCM  GI  Procedures:  none Antimicrobials:  Ceftriaxone.   Subjective: Alert, denies abdominal pain   Objective: Vitals:   08/25/18 0600 08/25/18 0700 08/25/18 0800 08/25/18 0804  BP: (!) 90/56 (!) 91/53  (!) 95/59  Pulse: 68 66 64 75  Resp: 18 20 20 19   Temp:  98 F (36.7 C)    TempSrc:  Oral    SpO2: 97% 98% 97% 100%  Weight:      Height:        Intake/Output Summary (Last 24 hours) at 08/25/2018 0901 Last data filed at 08/25/2018 0800 Gross per 24 hour  Intake 1440 ml  Output 1080 ml  Net 360 ml   Filed Weights   08/23/18 0500 08/24/18 0500 08/25/18 0500  Weight: (!) 151.1 kg (!) 151.3 kg (!) 155.4 kg    Examination:  General exam: Appears calm and comfortable , trach in place.  Respiratory system: Clear to auscultation. Respiratory effort normal. Cardiovascular system: S1 & S2 heard, RRR. No JVD, murmurs, rubs, gallops or clicks. No pedal edema. Gastrointestinal system: Abdomen is very distended, no tender.  Central nervous system: Alert and oriented. No focal neurological deficits. Extremities: Symmetric 5 x 5 power. Skin: No rashes, lesions or ulcers Psychiatry: Judgement and insight appear normal. Mood & affect appropriate.     Data Reviewed: I have personally reviewed following labs and imaging studies  CBC: Recent Labs  Lab 08/19/18 1907 08/20/18 0237 08/21/18 0443 08/22/18 0351  WBC 12.9* 23.4* 10.5 7.6  NEUTROABS 12.2*  --   --   --  HGB 11.0* 9.5* 8.9* 9.4*  HCT 35.5* 30.7* 28.8* 30.6*  MCV 88.1 88.5 87.8 88.2  PLT 298 283 254 119   Basic Metabolic Panel: Recent Labs  Lab 08/20/18 0748 08/20/18 1216 08/21/18 0443 08/22/18 0351 08/23/18 0338 08/24/18 0525  NA  --  142 144 144 144 141  K  --  3.5 3.4* 3.4* 3.5 3.8  CL  --  110 113* 113* 112* 112*  CO2  --  23 21* 22 23 21*  GLUCOSE  --  84 92 119* 95 98    BUN  --  18 15 12 9 15   CREATININE  --  1.61* 1.54* 1.33* 1.30* 1.20  CALCIUM  --  8.4* 8.3* 8.6* 8.6* 8.6*  MG 1.8  --   --   --   --   --    GFR: Estimated Creatinine Clearance: 82.6 mL/min (by C-G formula based on SCr of 1.2 mg/dL). Liver Function Tests: Recent Labs  Lab 08/19/18 1907 08/20/18 0237 08/21/18 0443  AST 29 25 21   ALT 29 26 26   ALKPHOS 213* 175* 134*  BILITOT 0.9 1.0 0.7  PROT 7.1 6.5 6.4*  ALBUMIN 2.9* 2.6* 2.5*   No results for input(s): LIPASE, AMYLASE in the last 168 hours. No results for input(s): AMMONIA in the last 168 hours. Coagulation Profile: Recent Labs  Lab 08/19/18 1907  INR 1.08   Cardiac Enzymes: Recent Labs  Lab 08/20/18 0237 08/20/18 0748 08/20/18 1424  TROPONINI 0.05* 0.04* 0.03*   BNP (last 3 results) No results for input(s): PROBNP in the last 8760 hours. HbA1C: No results for input(s): HGBA1C in the last 72 hours. CBG: Recent Labs  Lab 08/20/18 0329 08/22/18 1954 08/23/18 1552  GLUCAP 89 98 105*   Lipid Profile: No results for input(s): CHOL, HDL, LDLCALC, TRIG, CHOLHDL, LDLDIRECT in the last 72 hours. Thyroid Function Tests: No results for input(s): TSH, T4TOTAL, FREET4, T3FREE, THYROIDAB in the last 72 hours. Anemia Panel: No results for input(s): VITAMINB12, FOLATE, FERRITIN, TIBC, IRON, RETICCTPCT in the last 72 hours. Sepsis Labs: Recent Labs  Lab 08/19/18 1929 08/19/18 2311 08/20/18 0249 08/21/18 0443 08/22/18 0351  PROCALCITON  --   --  61.68 56.23 31.31  LATICACIDVEN 1.60 1.17  --   --   --     Recent Results (from the past 240 hour(s))  Culture, blood (Routine x 2)     Status: None   Collection Time: 08/19/18  7:07 PM  Result Value Ref Range Status   Specimen Description SITE NOT SPECIFIED  Final   Special Requests   Final    BOTTLES DRAWN AEROBIC AND ANAEROBIC Blood Culture adequate volume   Culture   Final    NO GROWTH 5 DAYS Performed at [facility], [address] [place],  [place] [address]    Report Status 08/24/2018 FINAL  Final  Culture, blood (Routine x 2)     Status: Abnormal   Collection Time: 08/19/18  7:13 PM  Result Value Ref Range Status   Specimen Description SITE NOT SPECIFIED  Final   Special Requests   Final    BOTTLES DRAWN AEROBIC AND ANAEROBIC Blood Culture adequate volume   Culture  Setup Time   Final    GRAM POSITIVE COCCI IN BOTH AEROBIC AND ANAEROBIC BOTTLES CRITICAL RESULT CALLED TO, READ BACK BY AND VERIFIED WITH: PHARMD J CARNEY 08/20/18 AT [id number] CM    Culture (A)  Final    STAPHYLOCOCCUS SPECIES (COAGULASE NEGATIVE) THE  SIGNIFICANCE OF ISOLATING THIS ORGANISM FROM A SINGLE SET OF BLOOD CULTURES WHEN MULTIPLE SETS ARE DRAWN IS UNCERTAIN. PLEASE NOTIFY THE MICROBIOLOGY DEPARTMENT WITHIN ONE WEEK IF SPECIATION AND SENSITIVITIES ARE REQUIRED. Performed at [facility], [place] [address] [place], [place] [address]    Report Status 08/22/2018 FINAL  Final  Blood Culture ID Panel (Reflexed)     Status: None   Collection Time: 08/19/18  7:13 PM  Result Value Ref Range Status   Enterococcus species NOT DETECTED NOT DETECTED Final   Listeria monocytogenes NOT DETECTED NOT DETECTED Final   Staphylococcus species NOT DETECTED NOT DETECTED Final   Staphylococcus aureus NOT DETECTED NOT DETECTED Final   Streptococcus species NOT DETECTED NOT DETECTED Final   Streptococcus agalactiae NOT DETECTED NOT DETECTED Final   Streptococcus pneumoniae NOT DETECTED NOT DETECTED Final   Streptococcus pyogenes NOT DETECTED NOT DETECTED Final   Acinetobacter baumannii NOT DETECTED NOT DETECTED Final   Enterobacteriaceae species NOT DETECTED NOT DETECTED Final   Enterobacter cloacae complex NOT DETECTED NOT DETECTED Final   Escherichia coli NOT DETECTED NOT DETECTED Final   Klebsiella oxytoca NOT DETECTED NOT DETECTED Final   Klebsiella pneumoniae NOT DETECTED NOT DETECTED Final   Proteus species NOT DETECTED NOT DETECTED Final   Serratia marcescens NOT  DETECTED NOT DETECTED Final   Haemophilus influenzae NOT DETECTED NOT DETECTED Final   Neisseria meningitidis NOT DETECTED NOT DETECTED Final   Pseudomonas aeruginosa NOT DETECTED NOT DETECTED Final   Candida albicans NOT DETECTED NOT DETECTED Final   Candida glabrata NOT DETECTED NOT DETECTED Final   Candida krusei NOT DETECTED NOT DETECTED Final   Candida parapsilosis NOT DETECTED NOT DETECTED Final   Candida tropicalis NOT DETECTED NOT DETECTED Final    Comment: Performed at [facility], [place] [address], [place], [place] [address]  Urine culture     Status: Abnormal   Collection Time: 08/19/18  7:19 PM  Result Value Ref Range Status   Specimen Description URINE, CATHETERIZED  Final   Special Requests   Final    NONE Performed at [facility], [place] [address], [place], [place] [address]    Culture (A)  Final    >=100,000 COLONIES/mL PROVIDENCIA STUARTII >=100,000 COLONIES/mL ESCHERICHIA COLI    Report Status 08/22/2018 FINAL  Final   Organism ID, Bacteria PROVIDENCIA STUARTII (A)  Final   Organism ID, Bacteria ESCHERICHIA COLI (A)  Final      Susceptibility   Escherichia coli - MIC*    AMPICILLIN >=32 RESISTANT Resistant     CEFAZOLIN <=4 SENSITIVE Sensitive     CEFTRIAXONE <=1 SENSITIVE Sensitive     CIPROFLOXACIN >=4 RESISTANT Resistant     GENTAMICIN <=1 SENSITIVE Sensitive     IMIPENEM <=0.25 SENSITIVE Sensitive     NITROFURANTOIN <=16 SENSITIVE Sensitive     TRIMETH/SULFA >=320 RESISTANT Resistant     AMPICILLIN/SULBACTAM >=32 RESISTANT Resistant     PIP/TAZO 8 SENSITIVE Sensitive     Extended ESBL NEGATIVE Sensitive     * >=100,000 COLONIES/mL ESCHERICHIA COLI   Providencia stuartii - MIC*    AMPICILLIN RESISTANT Resistant     CEFAZOLIN >=64 RESISTANT Resistant     CEFTRIAXONE <=1 SENSITIVE Sensitive     CIPROFLOXACIN >=4 RESISTANT Resistant     GENTAMICIN RESISTANT Resistant     IMIPENEM 1 SENSITIVE Sensitive     NITROFURANTOIN >=512 RESISTANT  Resistant     TRIMETH/SULFA <=20 SENSITIVE Sensitive     AMPICILLIN/SULBACTAM 4  SENSITIVE Sensitive     PIP/TAZO <=4 SENSITIVE Sensitive     * >=100,000 COLONIES/mL PROVIDENCIA STUARTII  MRSA PCR Screening     Status: None   Collection Time: 08/20/18  1:55 AM  Result Value Ref Range Status   MRSA by PCR NEGATIVE NEGATIVE Final    Comment:        The GeneXpert MRSA Assay (FDA approved for NASAL specimens only), is one component of a comprehensive MRSA colonization surveillance program. It is not intended to diagnose MRSA infection nor to guide or monitor treatment for MRSA infections. Performed at [facility], [place] [address] [place], [place] [address]          Radiology Studies: No results found.      Scheduled Meds: . allopurinol  100 mg Per Tube QPM  . amiodarone  200 mg Per Tube Daily  . aspirin  81 mg Oral Daily  . chlorhexidine gluconate (MEDLINE KIT)  15 mL Mouth Rinse BID  . docusate  100 mg Per Tube BID  . feeding supplement (PRO-STAT SUGAR FREE 64)  60 mL Per Tube BID  . feeding supplement (VITAL HIGH PROTEIN)  1,000 mL Per Tube Q24H  . fluticasone  2 spray Each Nare Daily  . heparin  5,000 Units Subcutaneous Q8H  . ipratropium-albuterol  3 mL Nebulization Q6H  . mouth rinse  15 mL Mouth Rinse QID  . Melatonin  3 mg Per Tube QHS  . metoprolol tartrate  25 mg Per Tube BID  . multivitamin  15 mL Oral Daily  . pantoprazole sodium  40 mg Per Tube Daily  . polyethylene glycol  17 g Per Tube BID  . potassium chloride  40 mEq Per Tube Daily  . sennosides  5 mL Per Tube QHS  . simethicone  80 mg Per Tube QID   Continuous Infusions: . sodium chloride 10 mL/hr at 08/24/18 0400  . cefTRIAXone (ROCEPHIN)  IV Stopped (08/24/18 1731)     LOS: 5 days    Time spent: 35 minutes.     Elmarie Shiley, MD [facility] Pager [phone]  If 7PM-7AM, please contact night-coverage [email or web address] Password [email or web address] 08/25/2018, 9:01 AM

## 2018-08-25 NOTE — Consult Note (Signed)
Subjective:   HPI  The patient is a 67 year old male with multiple medical problems who was transferred to this hospital from [facility]. He has a history of chronic hypoxic respiratory failure status post tracheostomy and ventilator dependent. We were asked to see him  By [facility] because of a chronic ileus. He has a markedly distended abdomen related to a chronic ileus. In looking back at his records and CT scans he was diagnosed with this in May of this year. He has had a follow-up CT scan this admission which shows unchanged mark dilatation of the colon again containing gas and some fluid. No evidence of small bowel obstruction. He does pass some gas. He has been getting Miralax at [facility]. He does have bowel movements per the nurse. This chronic ileus does not cause pain according to him.     Past Medical History:  Diagnosis Date  . Atrial fibrillation ([place])   . COPD (chronic obstructive pulmonary disease) ([place])   . DVT (deep venous thrombosis) ([place])   . GERD (gastroesophageal reflux disease)   . Hypertension   . Obesity   . Renal disorder   . Tracheostomy in place [facility])    History reviewed. No pertinent surgical history. Social History   Socioeconomic History  . Marital status: Divorced    Spouse name: Not on file  . Number of children: Not on file  . Years of education: Not on file  . Highest education level: Not on file  Occupational History  . Not on file  Social Needs  . Financial resource strain: Not on file  . Food insecurity:    Worry: Not on file    Inability: Not on file  . Transportation needs:    Medical: Not on file    Non-medical: Not on file  Tobacco Use  . Smoking status: Never Smoker  . Smokeless tobacco: Never Used  Substance and Sexual Activity  . Alcohol use: Not on file  . Drug use: Not on file  . Sexual activity: Not on file  Lifestyle  . Physical activity:    Days per week: Not on file    Minutes per session: Not on file  . Stress:  Not on file  Relationships  . Social connections:    Talks on phone: Not on file    Gets together: Not on file    Attends religious service: Not on file    Active member of club or organization: Not on file    Attends meetings of clubs or organizations: Not on file    Relationship status: Not on file  . Intimate partner violence:    Fear of current or ex partner: Not on file    Emotionally abused: Not on file    Physically abused: Not on file    Forced sexual activity: Not on file  Other Topics Concern  . Not on file  Social History Narrative  . Not on file   family history is not on file.  Current Facility-Administered Medications:  .  0.9 %  sodium chloride infusion, , Intravenous, PRN, Modena Jansky, MD, Last Rate: 10 mL/hr at 08/24/18 0400 .  acetaminophen (TYLENOL) solution 650 mg, 650 mg, Per Tube, Q6H PRN, 650 mg at 08/24/18 2157 **OR** acetaminophen (TYLENOL) suppository 650 mg, 650 mg, Rectal, Q6H PRN, Hammonds, Sharyn Blitz, MD .  albuterol (PROVENTIL) (2.5 MG/3ML) 0.083% nebulizer solution 2.5 mg, 2.5 mg, Nebulization, Q4H PRN, Jennelle Human B, NP .  allopurinol (ZYLOPRIM) tablet 100 mg, 100  mg, Per Tube, QPM, Jani Gravel, MD, 100 mg at 08/24/18 1658 .  alum & mag hydroxide-simeth (MAALOX/MYLANTA) 200-200-20 MG/5ML suspension 30 mL, 30 mL, Per Tube, Q6H PRN, Modena Jansky, MD, 30 mL at 08/21/18 2256 .  amiodarone (PACERONE) tablet 200 mg, 200 mg, Per Tube, Daily, Jani Gravel, MD, 200 mg at 08/25/18 0944 .  aspirin chewable tablet 81 mg, 81 mg, Oral, Daily, Jani Gravel, MD, 81 mg at 08/25/18 5885 .  bisacodyl (DULCOLAX) suppository 10 mg, 10 mg, Rectal, Daily PRN, Hammonds, Sharyn Blitz, MD, 10 mg at 08/25/18 1010 .  cefTRIAXone (ROCEPHIN) 1 g in sodium chloride 0.9 % 100 mL IVPB, 1 g, Intravenous, Q24H, Hongalgi, Lenis Dickinson, MD, Stopped at 08/24/18 1731 .  chlorhexidine gluconate (MEDLINE KIT) (PERIDEX) 0.12 % solution 15 mL, 15 mL, Mouth Rinse, BID, Simpson, Paula B, NP, 15  mL at 08/25/18 0734 .  diphenhydrAMINE (BENADRYL) capsule 25 mg, 25 mg, Per Tube, Q6H PRN, Jani Gravel, MD .  docusate (COLACE) 50 MG/5ML liquid 100 mg, 100 mg, Per Tube, BID, Hammonds, Sharyn Blitz, MD, 100 mg at 08/25/18 0936 .  feeding supplement (PRO-STAT SUGAR FREE 64) liquid 60 mL, 60 mL, Per Tube, BID, Hongalgi, Anand D, MD, 60 mL at 08/25/18 0936 .  feeding supplement (VITAL HIGH PROTEIN) liquid 1,000 mL, 1,000 mL, Per Tube, Q24H, Hongalgi, Anand D, MD, 1,000 mL at 08/25/18 1025 .  fluticasone (FLONASE) 50 MCG/ACT nasal spray 2 spray, 2 spray, Each Nare, Daily, Jani Gravel, MD, 2 spray at 08/25/18 [phone] .  guaiFENesin (ROBITUSSIN) 100 MG/5ML solution 200 mg, 200 mg, Per Tube, Q4H PRN, Jani Gravel, MD .  heparin injection 5,000 Units, 5,000 Units, Subcutaneous, Q8H, Jani Gravel, MD, 5,000 Units at 08/25/18 [phone] .  ipratropium-albuterol (DUONEB) 0.5-2.5 (3) MG/3ML nebulizer solution 3 mL, 3 mL, Nebulization, Q6H, Simpson, Paula B, NP, 3 mL at 08/25/18 0801 .  LORazepam (ATIVAN) tablet 1 mg, 1 mg, Per Tube, Q6H PRN, Jani Gravel, MD, 1 mg at 08/25/18 0000 .  magnesium hydroxide (MILK OF MAGNESIA) suspension 30 mL, 30 mL, Per Tube, Daily PRN, Jani Gravel, MD .  MEDLINE mouth rinse, 15 mL, Mouth Rinse, QID, Hongalgi, Anand D, MD, 15 mL at 08/25/18 0400 .  Melatonin TABS 3 mg, 3 mg, Per Tube, Loma Sousa, MD, 3 mg at 08/24/18 2154 .  metoprolol tartrate (LOPRESSOR) tablet 25 mg, 25 mg, Per Tube, BID, Hongalgi, Lenis Dickinson, MD, 25 mg at 08/25/18 0937 .  mineral oil enema 1 enema, 1 enema, Rectal, Daily PRN, Hammonds, Sharyn Blitz, MD .  multivitamin liquid 15 mL, 15 mL, Oral, Daily, Hongalgi, Anand D, MD, 15 mL at 08/25/18 0944 .  ondansetron (ZOFRAN) tablet 4 mg, 4 mg, Per Tube, Q6H PRN, Jani Gravel, MD .  pantoprazole sodium (PROTONIX) 40 mg/20 mL oral suspension 40 mg, 40 mg, Per Tube, Daily, Jani Gravel, MD, 40 mg at 08/25/18 0937 .  phenol (CHLORASEPTIC) mouth spray 1 spray, 1 spray, Mouth/Throat, Q4H PRN,  Jani Gravel, MD .  polyethylene glycol (MIRALAX / GLYCOLAX) packet 17 g, 17 g, Per Tube, BID, Hongalgi, Lenis Dickinson, MD, 17 g at 08/25/18 0945 .  potassium chloride 20 MEQ/15ML (10%) solution 40 mEq, 40 mEq, Per Tube, Daily, Hongalgi, Anand D, MD, 40 mEq at 08/25/18 0936 .  sennosides (SENOKOT) 8.8 MG/5ML syrup 5 mL, 5 mL, Per Tube, QHS, Hammonds, Sharyn Blitz, MD, 5 mL at 08/24/18 2155 .  simethicone (MYLICON) 40 NO/6.7EH suspension 80 mg, 80 mg, Per Tube,  QID, Modena Jansky, MD, 80 mg at 08/25/18 0944 Allergies  Allergen Reactions  . Ace Inhibitors Other (See Comments)    On MAR  . Codeine Other (See Comments)    On MAR     Objective:     BP 123/78   Pulse (!) 40   Temp 98.6 F (37 C) (Oral)   Resp 17   Ht 5' 5"  (1.651 m)   Wt (!) 155.4 kg   SpO2 100%   BMI 57.01 kg/m   No acute distress  On ventilator  Heart regular rhythm  Lungs clear  Abdomen distended, tympanitic, slight firmness but not tender  CT reviewed  Laboratory No components found for: D1    Assessment:     Chronic colonic ileus. In looking at his records this has been noted and unchanged since May of this year.      Plan:     Continue supportive care. Make sure electrolytes especially potassium are in normal range. Treat underlying medical problems. This is a chronic problem with very limited long-term options. I am not sure if motility agents (prokinetic agents) would be of benefit but I would suspect that in this particular situation they would not be but we could certainly try. We could try a low-dose of Reglan IV and see if this helps. Another medication that could be tried which is not available however at this hospital is Prucalopride.

## 2018-08-26 LAB — BASIC METABOLIC PANEL
ANION GAP: 8 (ref 5–15)
BUN: 28 mg/dL — ABNORMAL HIGH (ref 8–23)
CALCIUM: 8.7 mg/dL — AB (ref 8.9–10.3)
CO2: 22 mmol/L (ref 22–32)
Chloride: 112 mmol/L — ABNORMAL HIGH (ref 98–111)
Creatinine, Ser: 1.35 mg/dL — ABNORMAL HIGH (ref 0.61–1.24)
GFR, EST NON AFRICAN AMERICAN: 52 mL/min — AB (ref >60)
GLUCOSE: 110 mg/dL — AB (ref 70–99)
Potassium: 3.5 mmol/L (ref 3.5–5.1)
SODIUM: 142 mmol/L (ref 135–145)

## 2018-08-26 LAB — CBC
HCT: 30.3 % — ABNORMAL LOW (ref 39.0–52.0)
Hemoglobin: 9.3 g/dL — ABNORMAL LOW (ref 13.0–17.0)
MCH: 27.1 pg (ref 26.0–34.0)
MCHC: 30.7 g/dL (ref 30.0–36.0)
MCV: 88.3 fL (ref 78.0–100.0)
Platelets: 295 10*3/uL (ref 150–400)
RBC: 3.43 MIL/uL — ABNORMAL LOW (ref 4.22–5.81)
RDW: 17.7 % — AB (ref 11.5–15.5)
WBC: 6.6 10*3/uL (ref 4.0–10.5)

## 2018-08-26 MED ORDER — MAGNESIUM SULFATE 2 GM/50ML IV SOLN: 2.0000 g | Freq: Once | INTRAVENOUS | 0 refills | Status: DC

## 2018-08-26 MED ORDER — SENNOSIDES 8.8 MG/5ML PO SYRP: 5.0000 mL | ORAL_SOLUTION | Freq: Every day | ORAL | 0 refills | Status: DC

## 2018-08-26 MED ORDER — BISACODYL 10 MG RE SUPP: 10.0000 mg | Freq: Every day | RECTAL | 0 refills | Status: AC | PRN

## 2018-08-26 MED ORDER — MINERAL OIL RE ENEM: 1.0000 | ENEMA | Freq: Every day | RECTAL | 0 refills | Status: DC | PRN

## 2018-08-26 MED ORDER — METOCLOPRAMIDE HCL 5 MG/ML IJ SOLN: 5.0000 mg | Freq: Three times a day (TID) | INTRAMUSCULAR | 0 refills | Status: DC

## 2018-08-26 MED ORDER — PRO-STAT SUGAR FREE PO LIQD: 60.0000 mL | Freq: Two times a day (BID) | ORAL | 0 refills | Status: DC

## 2018-08-26 MED ORDER — ADULT MULTIVITAMIN LIQUID CH: 15.0000 mL | Freq: Every day | ORAL | 0 refills | Status: AC

## 2018-08-26 MED ORDER — SIMETHICONE 40 MG/0.6ML PO SUSP: 80.0000 mg | Freq: Four times a day (QID) | ORAL | 0 refills | Status: AC

## 2018-08-26 MED ORDER — POLYETHYLENE GLYCOL 3350 17 G PO PACK: 17.0000 g | PACK | Freq: Two times a day (BID) | ORAL | 0 refills | Status: AC

## 2018-08-26 MED ORDER — DOCUSATE SODIUM 50 MG/5ML PO LIQD: 100.0000 mg | Freq: Two times a day (BID) | ORAL | 0 refills | Status: DC

## 2018-08-26 MED ORDER — DOCUSATE SODIUM 50 MG/5ML PO LIQD: 100.0000 mg | Freq: Two times a day (BID) | ORAL | 0 refills | Status: AC

## 2018-08-26 MED ORDER — MAGNESIUM SULFATE 2 GM/50ML IV SOLN: 2.0000 g | Freq: Once | INTRAVENOUS | 0 refills | Status: AC

## 2018-08-26 MED ORDER — PRO-STAT SUGAR FREE PO LIQD: 60.0000 mL | Freq: Two times a day (BID) | ORAL | 0 refills | Status: AC

## 2018-08-26 MED ORDER — METOCLOPRAMIDE HCL 5 MG/ML IJ SOLN: 5.0000 mg | Freq: Three times a day (TID) | INTRAMUSCULAR | 0 refills | Status: AC

## 2018-08-26 MED ORDER — SODIUM CHLORIDE 0.9 % IV SOLN: 1.0000 g | INTRAVENOUS | 0 refills | Status: AC

## 2018-08-26 MED ORDER — MAGNESIUM SULFATE 2 GM/50ML IV SOLN
2.0000 g | Freq: Once | INTRAVENOUS | Status: AC
  Administered 2018-08-26: 2 g via INTRAVENOUS
  Filled 2018-08-26: qty 50

## 2018-08-26 MED ORDER — SODIUM CHLORIDE 0.9 % IV SOLN: INTRAVENOUS | Status: DC

## 2018-08-26 MED ORDER — SODIUM CHLORIDE 0.9 % IV SOLN: 1.0000 g | INTRAVENOUS | 0 refills | Status: DC

## 2018-08-26 MED ORDER — ADULT MULTIVITAMIN LIQUID CH: 15.0000 mL | Freq: Every day | ORAL | 0 refills | Status: DC

## 2018-08-26 MED ORDER — MINERAL OIL RE ENEM: 1.0000 | ENEMA | Freq: Every day | RECTAL | 0 refills | Status: AC | PRN

## 2018-08-26 MED ORDER — SENNOSIDES 8.8 MG/5ML PO SYRP: 5.0000 mL | ORAL_SOLUTION | Freq: Every day | ORAL | 0 refills | Status: AC

## 2018-08-26 MED ORDER — BISACODYL 10 MG RE SUPP: 10.0000 mg | Freq: Every day | RECTAL | 0 refills | Status: DC | PRN

## 2018-08-26 NOTE — Progress Notes (Signed)
CSW has called and set transport up for 2pm. There are no further CSW needs at this time. CSW will sign off.    Claude MangesKierra S. Destany Severns, MSW, LCSW-A Emergency Department Clinical Social Worker [phone]

## 2018-08-26 NOTE — Progress Notes (Signed)
Attempt to cal family on update of transfer back to [facility] today! Huntley EstelleLewis, Camden Knotek E, RN 08/26/2018 11:38 AM

## 2018-08-26 NOTE — Progress Notes (Signed)
CSW spoke with April RN and was informed that [facility] has spoken with [facility] and was informed that to can come to facility at this time.    Alex Reilly, MSW, LCSW-A Emergency Department Clinical Social Worker [phone]

## 2018-08-26 NOTE — Discharge Summary (Signed)
Physician Discharge Summary  Alex Reilly ZOX:[id number] DOB: [birth year] DOA: 08/19/2018  PCP: Sherron Monday, MD  Admit date: 08/19/2018 Discharge date: 08/26/2018  [facility]  Recommendations for Outpatient Follow-up:  1. Follow up with PCP in 1-2 weeks 2. Please obtain BMP/CBC in one week 3. Needs follow up with Gastroenterologist.     Discharge Condition: stable. CODE STATUS: full code.  Diet recommendation: NPO  Brief/Interim Summary: Brief Narrative: 67 year old male transferred to [facility] from [facility], PMH significant for chronic hypoxic respiratory failure, status post tracheostomy and ventilator dependent, COPD, PAF, CKD stage III, HTN, chronic ileus with chronic NG tube feeding, chronic indwelling Foley catheter presented with fever and hypotension. Admitted to stepdown unit for sepsis secondary to CAUTI. Foley catheter was not in the appropriate position, was changed. CCM consulted for vent management. Slowly improving.Possible DC to SNF 08/25/2018.   Assessment & Plan:   Principal Problem:   Sepsis (HCC) Active Problems:   Urinary tract infection associated with indwelling urethral catheter (HCC)   Anemia   Renal insufficiency   COPD (chronic obstructive pulmonary disease) (HCC)   Chronic respiratory failure with hypoxia (HCC)   Tracheostomy status (HCC)   1-Sepsis, due to CAUTI.  Blood culture one of two coagulase negative staph. Contaminant.  Urine culture grew 100,000 Provedentia and E coli.  Continue with ceftriaxone.  Spike fever last night. Continue with IV antibiotics. Continue overnight.  Day 7/10 antibiotics. Will need long course due to chronic foley catheter. He will need IV ceftriaxone, doubt he would absorb oral antibiotics.   Urinary retention/chronic indwelling Foley catheter:Foley catheter tip and balloon were within the urethra. Foley catheter was changed this admission.  Leukocytosis;  increase WBC today. Continue with Iv antibiotics. Normalized. Anemia; follow trend. Hd drop from 11--8.9  Hb has remain stable.  Monitor. Hemodilution.   Chronic Ileus;  Has NG tube in place.  Will consult GI for further recommendation. Recommended Reglan trial.   PAF; on amiodarone, metoprolol.  Not on anticoagulation. Will defer to PCP.    COPD: Stable without clinical bronchospasm.  Gout: Without acute flare.   Discharge Diagnoses:  Principal Problem:   Sepsis (HCC) Active Problems:   Urinary tract infection associated with indwelling urethral catheter (HCC)   Anemia   Renal insufficiency   COPD (chronic obstructive pulmonary disease) (HCC)   Chronic respiratory failure with hypoxia (HCC)   Tracheostomy status [facility])    Discharge Instructions  Discharge Instructions    Increase activity slowly   Complete by:  As directed      Allergies as of 08/26/2018      Reactions   Ace Inhibitors Other (See Comments)   On MAR   Codeine Other (See Comments)   On MAR      Medication List    STOP taking these medications   phenol 1.4 % Liqd Commonly known as:  CHLORASEPTIC     TAKE these medications   acetaminophen 500 MG tablet Commonly known as:  TYLENOL Place 500 mg into feeding tube 2 (two) times daily. What changed:  Another medication with the same name was removed. Continue taking this medication, and follow the directions you see here.   allopurinol 100 MG tablet Commonly known as:  ZYLOPRIM Place 100 mg into feeding tube every evening.   amiodarone 200 MG tablet Commonly known as:  PACERONE Place 200 mg into feeding tube daily.   aspirin 81 MG chewable tablet Chew 81 mg by mouth daily.  bisacodyl 10 MG suppository Commonly known as:  DULCOLAX Place 1 suppository (10 mg total) rectally daily as needed for moderate constipation.   cefTRIAXone 1 g in sodium chloride 0.9 % 100 mL Inject 1 g into the vein daily.   chlorhexidine 0.12 %  solution Commonly known as:  PERIDEX Use as directed 15 mLs in the mouth or throat 2 (two) times daily.   diphenhydrAMINE 25 mg capsule Commonly known as:  BENADRYL Place 25 mg into feeding tube every 6 (six) hours as needed for itching or allergies.   docusate 50 MG/5ML liquid Commonly known as:  COLACE Place 10 mLs (100 mg total) into feeding tube 2 (two) times daily.   feeding supplement (OSMOLITE 1.5 CAL) Liqd Place 1,000 mLs into feeding tube continuous.   feeding supplement (PRO-STAT SUGAR FREE 64) Liqd Place 60 mLs into feeding tube 2 (two) times daily.   fluticasone 50 MCG/ACT nasal spray Commonly known as:  FLONASE Place 2 sprays into both nostrils daily.   guaifenesin 100 MG/5ML syrup Commonly known as:  ROBITUSSIN Place 200 mg into feeding tube every 4 (four) hours as needed for cough.   heparin 5000 UNIT/ML injection Inject 5,000 Units into the skin every 8 (eight) hours.   ipratropium-albuterol 0.5-2.5 (3) MG/3ML Soln Commonly known as:  DUONEB Take 3 mLs by nebulization every 6 (six) hours.   lansoprazole 30 MG disintegrating tablet Commonly known as:  PREVACID SOLUTAB Place 30 mg into feeding tube daily.   LORazepam 1 MG tablet Commonly known as:  ATIVAN Place 1 mg into feeding tube every 6 (six) hours as needed for anxiety.   magnesium hydroxide 400 MG/5ML suspension Commonly known as:  MILK OF MAGNESIA Place 30 mLs into feeding tube daily as needed for mild constipation.   magnesium sulfate 2 GM/50ML Soln infusion Inject 50 mLs (2 g total) into the vein once for 1 dose.   Melatonin 3 MG Tabs Place 3 mg into feeding tube at bedtime.   metoCLOPramide 5 MG/ML injection Commonly known as:  REGLAN Inject 1 mL (5 mg total) into the vein every 8 (eight) hours.   metoprolol tartrate 25 MG tablet Commonly known as:  LOPRESSOR Place 25 mg into feeding tube 2 (two) times daily.   mineral oil enema Place 133 mLs (1 enema total) rectally daily as needed  for severe constipation (If no BM for > 24hrs).   multivitamin Liqd Take 15 mLs by mouth daily.   ondansetron 40 MG/20ML Soln injection Commonly known as:  ZOFRAN Inject 4 mg into the vein every 6 (six) hours as needed for nausea or vomiting. What changed:  Another medication with the same name was removed. Continue taking this medication, and follow the directions you see here.   polyethylene glycol packet Commonly known as:  MIRALAX / GLYCOLAX Place 17 g into feeding tube 2 (two) times daily. What changed:    when to take this  reasons to take this   potassium chloride SA 20 MEQ tablet Commonly known as:  K-DUR,KLOR-CON 20 mEq every evening. Per tube   sennosides 8.8 MG/5ML syrup Commonly known as:  SENOKOT Place 5 mLs into feeding tube at bedtime.   sertraline 100 MG tablet Commonly known as:  ZOLOFT Place 100 mg into feeding tube at bedtime.   simethicone 40 MG/0.6ML drops Commonly known as:  MYLICON Place 1.2 mLs (80 mg total) into feeding tube 4 (four) times daily.       Allergies  Allergen Reactions  . Ace  Inhibitors Other (See Comments)    On MAR  . Codeine Other (See Comments)    On MAR    Consultations:  CCM   Procedures/Studies: Ct Abdomen Pelvis W Contrast  Addendum Date: 08/19/2018   ADDENDUM REPORT: 08/19/2018 21:25 ADDENDUM: The IMPRESSION should also include: Foley catheter tip and balloon within the urethra. Recommend advancement into the bladder after balloon deflation. Electronically Signed   By: Harmon Pier M.D.   On: 08/19/2018 21:25   Result Date: 08/19/2018 CLINICAL DATA:  67 year old male with acute abdominal pain with fever. EXAM: CT ABDOMEN AND PELVIS WITH CONTRAST TECHNIQUE: Multidetector CT imaging of the abdomen and pelvis was performed using the standard protocol following bolus administration of intravenous contrast. CONTRAST:  ISOVUE-300 IOPAMIDOL (ISOVUE-300) INJECTION 61% COMPARISON:  07/08/2018 and prior CTs FINDINGS:  Lower chest: Cardiomegaly, coronary artery disease and bibasilar atelectasis again noted. Hepatobiliary: No significant hepatic or gallbladder abnormalities. No biliary dilatation. Pancreas: Unremarkable Spleen: Unremarkable Adrenals/Urinary Tract: The kidneys, adrenal glands and bladder are unremarkable. A Foley catheter is noted with tip and inflated balloon in the urethra. Stomach/Bowel: NG tube within the stomach is noted. Again noted is marked dilatation of the transverse colon and filled mainly with gas. Fluid within portions of the ascending and transverse colon are noted. The transverse colon measures up to 16 cm in greatest diameter. No small bowel dilatation identified. No definite bowel wall thickening or inflammatory changes noted. Vascular/Lymphatic: Aortic atherosclerosis. An IVC filter is again noted. No enlarged abdominal or pelvic lymph nodes. Reproductive: No significant prostate abnormalities. Other: No free fluid, abscess or pneumoperitoneum. Musculoskeletal: No acute or suspicious bony abnormalities. IMPRESSION: 1. Unchanged marked dilatation of the colon again noted (primarily transverse) containing gas and some fluid without obstructing cause. This is unchanged from 05/06/2018 likely representing a chronic ileus. No evidence of small bowel obstruction, abscess or pneumoperitoneum. 2. No acute abnormalities identified. 3. Cardiomegaly, coronary artery disease and unchanged mild bibasilar atelectasis. 4.  Aortic Atherosclerosis (ICD10-I70.0). Electronically Signed: By: Harmon Pier M.D. On: 08/19/2018 21:17   Dg Chest Port 1 View  Result Date: 08/21/2018 CLINICAL DATA:  COPD. EXAM: PORTABLE CHEST 1 VIEW COMPARISON:  Chest x-ray dated August 19, 2018. FINDINGS: Unchanged tracheostomy and enteric tubes. Stable cardiomegaly and pulmonary vascular congestion. Unchanged low lung volumes with bibasilar atelectasis. No pleural effusion or pneumothorax. No acute osseous abnormality. Unchanged  elevation of the right hemidiaphragm. IMPRESSION: Stable low volume chest with bibasilar atelectasis. Electronically Signed   By: Obie Dredge M.D.   On: 08/21/2018 07:41   Dg Chest Portable 1 View  Result Date: 08/19/2018 CLINICAL DATA:  67 year old male with new onset fever EXAM: PORTABLE CHEST 1 VIEW COMPARISON:  Prior chest x-ray 07/08/2018 FINDINGS: Stable cardiomegaly. Mediastinal contours are unchanged. The patient is intubated. The endotracheal tube tip is 5.3 cm from the carina. A gastric tube is present. The tip lies below the diaphragm, presumably in the stomach. Similar appearance of the lungs with low inspiratory volumes and chronic elevation of the right hemidiaphragm. Nonspecific bibasilar opacities may reflect atelectasis or infiltrate but appears similar compared to prior. Mild vascular congestion, no overt edema. IMPRESSION: 1. The tip of the endotracheal tube is 5.3 cm above the carina. 2. The tip of the gastric tube lies inferior to the diaphragm, presumably within the stomach 3. Stable appearance of the chest with chronic elevation of the right hemidiaphragm, overall low inspiratory volumes and probable bibasilar atelectasis. Electronically Signed   By: Isac Caddy.D.  On: 08/19/2018 19:34     Subjective:  alert. Denies abdominal pain, had BM.   Discharge Exam: Vitals:   08/26/18 0755 08/26/18 0800  BP:  112/75  Pulse: 65 62  Resp: 18 (!) 21  Temp:    SpO2: 98% 99%   Vitals:   08/26/18 0726 08/26/18 0738 08/26/18 0755 08/26/18 0800  BP:    112/75  Pulse:   65 62  Resp:   18 (!) 21  Temp:  98.3 F (36.8 C)    TempSrc:  Oral    SpO2: 100%  98% 99%  Weight:      Height:        General: Pt is alert, awake, not in acute distress, trach, vent Cardiovascular: RRR, S1/S2 +, no rubs, no gallops Respiratory: CTA bilaterally, no wheezing, no rhonchi Abdominal: Distended, Soft.  Extremities: no edema, no cyanosis    The results of significant diagnostics  from this hospitalization (including imaging, microbiology, ancillary and laboratory) are listed below for reference.     Microbiology: Recent Results (from the past 240 hour(s))  Culture, blood (Routine x 2)     Status: None   Collection Time: 08/19/18  7:07 PM  Result Value Ref Range Status   Specimen Description SITE NOT SPECIFIED  Final   Special Requests   Final    BOTTLES DRAWN AEROBIC AND ANAEROBIC Blood Culture adequate volume   Culture   Final    NO GROWTH 5 DAYS Performed at [facility], [address] [place], [place] [address]    Report Status 08/24/2018 FINAL  Final  Culture, blood (Routine x 2)     Status: Abnormal   Collection Time: 08/19/18  7:13 PM  Result Value Ref Range Status   Specimen Description SITE NOT SPECIFIED  Final   Special Requests   Final    BOTTLES DRAWN AEROBIC AND ANAEROBIC Blood Culture adequate volume   Culture  Setup Time   Final    GRAM POSITIVE COCCI IN BOTH AEROBIC AND ANAEROBIC BOTTLES CRITICAL RESULT CALLED TO, READ BACK BY AND VERIFIED WITH: PHARMD J CARNEY 08/20/18 AT 2015BY CM    Culture (A)  Final    STAPHYLOCOCCUS SPECIES (COAGULASE NEGATIVE) THE SIGNIFICANCE OF ISOLATING THIS ORGANISM FROM A SINGLE SET OF BLOOD CULTURES WHEN MULTIPLE SETS ARE DRAWN IS UNCERTAIN. PLEASE NOTIFY THE MICROBIOLOGY DEPARTMENT WITHIN ONE WEEK IF SPECIATION AND SENSITIVITIES ARE REQUIRED. Performed at [facility], [address] [place], [place] [address]    Report Status 08/22/2018 FINAL  Final  Blood Culture ID Panel (Reflexed)     Status: None   Collection Time: 08/19/18  7:13 PM  Result Value Ref Range Status   Enterococcus species NOT DETECTED NOT DETECTED Final   Listeria monocytogenes NOT DETECTED NOT DETECTED Final   Staphylococcus species NOT DETECTED NOT DETECTED Final   Staphylococcus aureus NOT DETECTED NOT DETECTED Final   Streptococcus species NOT DETECTED NOT DETECTED Final   Streptococcus agalactiae NOT DETECTED NOT  DETECTED Final   Streptococcus pneumoniae NOT DETECTED NOT DETECTED Final   Streptococcus pyogenes NOT DETECTED NOT DETECTED Final   Acinetobacter baumannii NOT DETECTED NOT DETECTED Final   Enterobacteriaceae species NOT DETECTED NOT DETECTED Final   Enterobacter cloacae complex NOT DETECTED NOT DETECTED Final   Escherichia coli NOT DETECTED NOT DETECTED Final   Klebsiella oxytoca NOT DETECTED NOT DETECTED Final   Klebsiella pneumoniae NOT DETECTED NOT DETECTED Final   Proteus species NOT DETECTED NOT DETECTED Final  Serratia marcescens NOT DETECTED NOT DETECTED Final   Haemophilus influenzae NOT DETECTED NOT DETECTED Final   Neisseria meningitidis NOT DETECTED NOT DETECTED Final   Pseudomonas aeruginosa NOT DETECTED NOT DETECTED Final   Candida albicans NOT DETECTED NOT DETECTED Final   Candida glabrata NOT DETECTED NOT DETECTED Final   Candida krusei NOT DETECTED NOT DETECTED Final   Candida parapsilosis NOT DETECTED NOT DETECTED Final   Candida tropicalis NOT DETECTED NOT DETECTED Final    Comment: Performed at [facility], [address] [place], [place] [address]  Urine culture     Status: Abnormal   Collection Time: 08/19/18  7:19 PM  Result Value Ref Range Status   Specimen Description URINE, CATHETERIZED  Final   Special Requests   Final    NONE Performed at [facility], [address] [place], [place] [address]    Culture (A)  Final    >=100,000 COLONIES/mL PROVIDENCIA STUARTII >=100,000 COLONIES/mL ESCHERICHIA COLI    Report Status 08/22/2018 FINAL  Final   Organism ID, Bacteria PROVIDENCIA STUARTII (A)  Final   Organism ID, Bacteria ESCHERICHIA COLI (A)  Final      Susceptibility   Escherichia coli - MIC*    AMPICILLIN >=32 RESISTANT Resistant     CEFAZOLIN <=4 SENSITIVE Sensitive     CEFTRIAXONE <=1 SENSITIVE Sensitive     CIPROFLOXACIN >=4 RESISTANT Resistant     GENTAMICIN <=1 SENSITIVE Sensitive     IMIPENEM <=0.25 SENSITIVE Sensitive      NITROFURANTOIN <=16 SENSITIVE Sensitive     TRIMETH/SULFA >=320 RESISTANT Resistant     AMPICILLIN/SULBACTAM >=32 RESISTANT Resistant     PIP/TAZO 8 SENSITIVE Sensitive     Extended ESBL NEGATIVE Sensitive     * >=100,000 COLONIES/mL ESCHERICHIA COLI   Providencia stuartii - MIC*    AMPICILLIN RESISTANT Resistant     CEFAZOLIN >=64 RESISTANT Resistant     CEFTRIAXONE <=1 SENSITIVE Sensitive     CIPROFLOXACIN >=4 RESISTANT Resistant     GENTAMICIN RESISTANT Resistant     IMIPENEM 1 SENSITIVE Sensitive     NITROFURANTOIN >=512 RESISTANT Resistant     TRIMETH/SULFA <=20 SENSITIVE Sensitive     AMPICILLIN/SULBACTAM 4 SENSITIVE Sensitive     PIP/TAZO <=4 SENSITIVE Sensitive     * >=100,000 COLONIES/mL PROVIDENCIA STUARTII  MRSA PCR Screening     Status: None   Collection Time: 08/20/18  1:55 AM  Result Value Ref Range Status   MRSA by PCR NEGATIVE NEGATIVE Final    Comment:        The GeneXpert MRSA Assay (FDA approved for NASAL specimens only), is one component of a comprehensive MRSA colonization surveillance program. It is not intended to diagnose MRSA infection nor to guide or monitor treatment for MRSA infections. Performed at [facility], [address] [place], [place] [address]      Labs: BNP (last 3 results) No results for input(s): BNP in the last 8760 hours. Basic Metabolic Panel: Recent Labs  Lab 08/20/18 0748  08/22/18 0351 08/23/18 0338 08/24/18 0525 08/25/18 1422 08/26/18 0626  NA  --    < > 144 144 141 142 142  K  --    < > 3.4* 3.5 3.8 3.8 3.5  CL  --    < > 113* 112* 112* 112* 112*  CO2  --    < > 22 23 21* 23 22  GLUCOSE  --    < > 119* 95 98  97 110*  BUN  --    < > 12 9 15  25* 28*  CREATININE  --    < > 1.33* 1.30* 1.20 1.57* 1.35*  CALCIUM  --    < > 8.6* 8.6* 8.6* 8.7* 8.7*  MG 1.8  --   --   --   --  1.8  --    < > = values in this interval not displayed.   Liver Function Tests: Recent Labs  Lab 08/19/18 1907 08/20/18 0237  08/21/18 0443  AST 29 25 21   ALT 29 26 26   ALKPHOS 213* 175* 134*  BILITOT 0.9 1.0 0.7  PROT 7.1 6.5 6.4*  ALBUMIN 2.9* 2.6* 2.5*   No results for input(s): LIPASE, AMYLASE in the last 168 hours. No results for input(s): AMMONIA in the last 168 hours. CBC: Recent Labs  Lab 08/19/18 1907 08/20/18 0237 08/21/18 0443 08/22/18 0351 08/25/18 0937 08/26/18 0626  WBC 12.9* 23.4* 10.5 7.6 10.9* 6.6  NEUTROABS 12.2*  --   --   --   --   --   HGB 11.0* 9.5* 8.9* 9.4* 9.7* 9.3*  HCT 35.5* 30.7* 28.8* 30.6* 31.5* 30.3*  MCV 88.1 88.5 87.8 88.2 88.7 88.3  PLT 298 283 254 266 306 295   Cardiac Enzymes: Recent Labs  Lab 08/20/18 0237 08/20/18 0748 08/20/18 1424  TROPONINI 0.05* 0.04* 0.03*   BNP: Invalid input(s): POCBNP CBG: Recent Labs  Lab 08/20/18 0329 08/22/18 1954 08/23/18 1552  GLUCAP 89 98 105*   D-Dimer No results for input(s): DDIMER in the last 72 hours. Hgb A1c No results for input(s): HGBA1C in the last 72 hours. Lipid Profile No results for input(s): CHOL, HDL, LDLCALC, TRIG, CHOLHDL, LDLDIRECT in the last 72 hours. Thyroid function studies No results for input(s): TSH, T4TOTAL, T3FREE, THYROIDAB in the last 72 hours.  Invalid input(s): FREET3 Anemia work up No results for input(s): VITAMINB12, FOLATE, FERRITIN, TIBC, IRON, RETICCTPCT in the last 72 hours. Urinalysis    Component Value Date/Time   COLORURINE YELLOW 08/19/2018 1916   APPEARANCEUR CLOUDY (A) 08/19/2018 1916   LABSPEC 1.015 08/19/2018 1916   PHURINE 7.0 08/19/2018 1916   GLUCOSEU NEGATIVE 08/19/2018 1916   HGBUR MODERATE (A) 08/19/2018 1916   BILIRUBINUR NEGATIVE 08/19/2018 1916   KETONESUR NEGATIVE 08/19/2018 1916   PROTEINUR 30 (A) 08/19/2018 1916   NITRITE POSITIVE (A) 08/19/2018 1916   LEUKOCYTESUR MODERATE (A) 08/19/2018 1916   Sepsis Labs Invalid input(s): PROCALCITONIN,  WBC,  LACTICIDVEN Microbiology Recent Results (from the past 240 hour(s))  Culture, blood (Routine x  2)     Status: None   Collection Time: 08/19/18  7:07 PM  Result Value Ref Range Status   Specimen Description SITE NOT SPECIFIED  Final   Special Requests   Final    BOTTLES DRAWN AEROBIC AND ANAEROBIC Blood Culture adequate volume   Culture   Final    NO GROWTH 5 DAYS Performed at [facility], [address], [place], [place] [address]    Report Status 08/24/2018 FINAL  Final  Culture, blood (Routine x 2)     Status: Abnormal   Collection Time: 08/19/18  7:13 PM  Result Value Ref Range Status   Specimen Description SITE NOT SPECIFIED  Final   Special Requests   Final    BOTTLES DRAWN AEROBIC AND ANAEROBIC Blood Culture adequate volume   Culture  Setup Time   Final    GRAM POSITIVE COCCI IN BOTH AEROBIC AND ANAEROBIC  BOTTLES CRITICAL RESULT CALLED TO, READ BACK BY AND VERIFIED WITH: PHARMD J CARNEY 08/20/18 AT 2015BY CM    Culture (A)  Final    STAPHYLOCOCCUS SPECIES (COAGULASE NEGATIVE) THE SIGNIFICANCE OF ISOLATING THIS ORGANISM FROM A SINGLE SET OF BLOOD CULTURES WHEN MULTIPLE SETS ARE DRAWN IS UNCERTAIN. PLEASE NOTIFY THE MICROBIOLOGY DEPARTMENT WITHIN ONE WEEK IF SPECIATION AND SENSITIVITIES ARE REQUIRED. Performed at [facility], [address] [place], [place] [address]    Report Status 08/22/2018 FINAL  Final  Blood Culture ID Panel (Reflexed)     Status: None   Collection Time: 08/19/18  7:13 PM  Result Value Ref Range Status   Enterococcus species NOT DETECTED NOT DETECTED Final   Listeria monocytogenes NOT DETECTED NOT DETECTED Final   Staphylococcus species NOT DETECTED NOT DETECTED Final   Staphylococcus aureus NOT DETECTED NOT DETECTED Final   Streptococcus species NOT DETECTED NOT DETECTED Final   Streptococcus agalactiae NOT DETECTED NOT DETECTED Final   Streptococcus pneumoniae NOT DETECTED NOT DETECTED Final   Streptococcus pyogenes NOT DETECTED NOT DETECTED Final   Acinetobacter baumannii NOT DETECTED NOT DETECTED Final   Enterobacteriaceae  species NOT DETECTED NOT DETECTED Final   Enterobacter cloacae complex NOT DETECTED NOT DETECTED Final   Escherichia coli NOT DETECTED NOT DETECTED Final   Klebsiella oxytoca NOT DETECTED NOT DETECTED Final   Klebsiella pneumoniae NOT DETECTED NOT DETECTED Final   Proteus species NOT DETECTED NOT DETECTED Final   Serratia marcescens NOT DETECTED NOT DETECTED Final   Haemophilus influenzae NOT DETECTED NOT DETECTED Final   Neisseria meningitidis NOT DETECTED NOT DETECTED Final   Pseudomonas aeruginosa NOT DETECTED NOT DETECTED Final   Candida albicans NOT DETECTED NOT DETECTED Final   Candida glabrata NOT DETECTED NOT DETECTED Final   Candida krusei NOT DETECTED NOT DETECTED Final   Candida parapsilosis NOT DETECTED NOT DETECTED Final   Candida tropicalis NOT DETECTED NOT DETECTED Final    Comment: Performed at [facility], [address], [place], [place] [address]  Urine culture     Status: Abnormal   Collection Time: 08/19/18  7:19 PM  Result Value Ref Range Status   Specimen Description URINE, CATHETERIZED  Final   Special Requests   Final    NONE Performed at [facility], [address], [place], [place] [address]    Culture (A)  Final    >=100,000 COLONIES/mL PROVIDENCIA STUARTII >=100,000 COLONIES/mL ESCHERICHIA COLI    Report Status 08/22/2018 FINAL  Final   Organism ID, Bacteria PROVIDENCIA STUARTII (A)  Final   Organism ID, Bacteria ESCHERICHIA COLI (A)  Final      Susceptibility   Escherichia coli - MIC*    AMPICILLIN >=32 RESISTANT Resistant     CEFAZOLIN <=4 SENSITIVE Sensitive     CEFTRIAXONE <=1 SENSITIVE Sensitive     CIPROFLOXACIN >=4 RESISTANT Resistant     GENTAMICIN <=1 SENSITIVE Sensitive     IMIPENEM <=0.25 SENSITIVE Sensitive     NITROFURANTOIN <=16 SENSITIVE Sensitive     TRIMETH/SULFA >=320 RESISTANT Resistant     AMPICILLIN/SULBACTAM >=32 RESISTANT Resistant     PIP/TAZO 8 SENSITIVE Sensitive     Extended ESBL NEGATIVE Sensitive     *  >=100,000 COLONIES/mL ESCHERICHIA COLI   Providencia stuartii - MIC*    AMPICILLIN RESISTANT Resistant     CEFAZOLIN >=64 RESISTANT Resistant     CEFTRIAXONE <=1 SENSITIVE Sensitive     CIPROFLOXACIN >=4 RESISTANT Resistant  GENTAMICIN RESISTANT Resistant     IMIPENEM 1 SENSITIVE Sensitive     NITROFURANTOIN >=512 RESISTANT Resistant     TRIMETH/SULFA <=20 SENSITIVE Sensitive     AMPICILLIN/SULBACTAM 4 SENSITIVE Sensitive     PIP/TAZO <=4 SENSITIVE Sensitive     * >=100,000 COLONIES/mL PROVIDENCIA STUARTII  MRSA PCR Screening     Status: None   Collection Time: 08/20/18  1:55 AM  Result Value Ref Range Status   MRSA by PCR NEGATIVE NEGATIVE Final    Comment:        The GeneXpert MRSA Assay (FDA approved for NASAL specimens only), is one component of a comprehensive MRSA colonization surveillance program. It is not intended to diagnose MRSA infection nor to guide or monitor treatment for MRSA infections. Performed at [facility], [address] [place], [place] [address]      Time coordinating discharge; 35 minutes.   SIGNED:   Alba Cory, MD  [facility] 08/26/2018, 8:57 AM Pager [phone]  If 7PM-7AM, please contact night-coverage [email or web address] Password [email or web address]

## 2018-08-26 NOTE — Progress Notes (Signed)
[facility] here to pick up patient, EMTALA not complete by [facility] physician, awaiting call back to complete to transfer to [facility]. Alex Reilly, Romey Mathieson E, RN 08/26/2018 1125

## 2018-08-26 NOTE — Progress Notes (Addendum)
9:42am- RN to call report to [phone]. Receiving MD is Eliezer ChampagneJason Vaneyk. Transport to be scheduled for 2pm as [name] cant take pt before this time.   CSW aware that pt is ready to discharge back to [facility]. CSW has reached out to [place] with [facility] and left voicemail asking for call back. CSW has emailed over discharge summary to [name] at this time. CSW will continue to follow for discharge needs.   Claude MangesKierra S. Takita Riecke, MSW, LCSW-A Emergency Department Clinical Social Worker [phone]

## 2018-08-26 NOTE — Progress Notes (Signed)
[facility] Progress Note  Subjective: Patient was tried on Reglan yesterday. Nurse reports he had a bowel movement in his abdomen went down a little but then came right back up later.  Objective: Vital signs in last 24 hours: Temp:  [98.3 F (36.8 C)-98.7 F (37.1 C)] 98.3 F (36.8 C) (09/12 0738) Pulse Rate:  [36-126] 62 (09/12 0800) Resp:  [13-25] 21 (09/12 0800) BP: (82-136)/(55-85) 112/75 (09/12 0800) SpO2:  [95 %-100 %] 99 % (09/12 0800) FiO2 (%):  [30 %] 30 % (09/12 0755) Weight:  [149.2 kg] 149.2 kg (09/12 0500) Weight change: -6.2 kg   PE:  Still a distended abdomen from chronic colonic distention nontender  Lab Results: Results for orders placed or performed during the hospital encounter of 08/19/18 (from the past 24 hour(s))  CBC     Status: Abnormal   Collection Time: 08/25/18  9:37 AM  Result Value Ref Range   WBC 10.9 (H) 4.0 - 10.5 K/uL   RBC 3.55 (L) 4.22 - 5.81 MIL/uL   Hemoglobin 9.7 (L) 13.0 - 17.0 g/dL   HCT 16.1 (L) 09.6 - 04.5 %   MCV 88.7 78.0 - 100.0 fL   MCH 27.3 26.0 - 34.0 pg   MCHC 30.8 30.0 - 36.0 g/dL   RDW 40.9 (H) 81.1 - 91.4 %   Platelets 306 150 - 400 K/uL  Basic metabolic panel     Status: Abnormal   Collection Time: 08/25/18  2:22 PM  Result Value Ref Range   Sodium 142 135 - 145 mmol/L   Potassium 3.8 3.5 - 5.1 mmol/L   Chloride 112 (H) 98 - 111 mmol/L   CO2 23 22 - 32 mmol/L   Glucose, Bld 97 70 - 99 mg/dL   BUN 25 (H) 8 - 23 mg/dL   Creatinine, Ser 7.82 (H) 0.61 - 1.24 mg/dL   Calcium 8.7 (L) 8.9 - 10.3 mg/dL   GFR calc non Af Amer 44 (L) >60 mL/min   GFR calc Af Amer 51 (L) >60 mL/min   Anion gap 7 5 - 15  Magnesium     Status: None   Collection Time: 08/25/18  2:22 PM  Result Value Ref Range   Magnesium 1.8 1.7 - 2.4 mg/dL  CBC     Status: Abnormal   Collection Time: 08/26/18  6:26 AM  Result Value Ref Range   WBC 6.6 4.0 - 10.5 K/uL   RBC 3.43 (L) 4.22 - 5.81 MIL/uL   Hemoglobin 9.3 (L) 13.0 - 17.0 g/dL    HCT 95.6 (L) 21.3 - 52.0 %   MCV 88.3 78.0 - 100.0 fL   MCH 27.1 26.0 - 34.0 pg   MCHC 30.7 30.0 - 36.0 g/dL   RDW 08.6 (H) 57.8 - 46.9 %   Platelets 295 150 - 400 K/uL  Basic metabolic panel     Status: Abnormal   Collection Time: 08/26/18  6:26 AM  Result Value Ref Range   Sodium 142 135 - 145 mmol/L   Potassium 3.5 3.5 - 5.1 mmol/L   Chloride 112 (H) 98 - 111 mmol/L   CO2 22 22 - 32 mmol/L   Glucose, Bld 110 (H) 70 - 99 mg/dL   BUN 28 (H) 8 - 23 mg/dL   Creatinine, Ser 6.29 (H) 0.61 - 1.24 mg/dL   Calcium 8.7 (L) 8.9 - 10.3 mg/dL   GFR calc non Af Amer 52 (L) >60 mL/min   GFR calc Af Amer >60 >60 mL/min  Anion gap 8 5 - 15    Studies/Results: No results found.    Assessment: Chronic colonic ileus, asymptomatic  Plan:   From a GI standpoint there is not much more we can do for this. I see no reason to follow him up as an outpatient. He can return to [facility] where they can manage this symptomatically. Reglan can be tried by mouth but if it does not help I would not continue it.    SAM F Matty Vanroekel 08/26/2018, 9:09 AM  Pager: [phone] If no answer or after 5 PM call [phone]

## 2018-09-06 ENCOUNTER — Emergency Department (HOSPITAL_COMMUNITY)
Admission: EM | Admit: 2018-09-06 | Discharge: 2018-09-06 | Disposition: A | Discharge status: Other Acute Inpatient Hospital | Payer: Medicare Other | Attending: Emergency Medicine | Admitting: Emergency Medicine

## 2018-09-06 ENCOUNTER — Emergency Department (HOSPITAL_COMMUNITY): Payer: Medicare Other

## 2018-09-06 ENCOUNTER — Other Ambulatory Visit: Payer: Self-pay

## 2018-09-06 ENCOUNTER — Encounter (HOSPITAL_COMMUNITY): Payer: Self-pay | Admitting: Emergency Medicine

## 2018-09-06 DIAGNOSIS — Z930 Tracheostomy status: Secondary | ICD-10-CM | POA: Insufficient documentation

## 2018-09-06 DIAGNOSIS — Z79899 Other long term (current) drug therapy: Secondary | ICD-10-CM | POA: Diagnosis not present

## 2018-09-06 DIAGNOSIS — Z593 Problems related to living in residential institution: Secondary | ICD-10-CM | POA: Diagnosis not present

## 2018-09-06 DIAGNOSIS — Z7982 Long term (current) use of aspirin: Secondary | ICD-10-CM | POA: Insufficient documentation

## 2018-09-06 DIAGNOSIS — I10 Essential (primary) hypertension: Secondary | ICD-10-CM | POA: Insufficient documentation

## 2018-09-06 DIAGNOSIS — R109 Unspecified abdominal pain: Secondary | ICD-10-CM | POA: Insufficient documentation

## 2018-09-06 DIAGNOSIS — J449 Chronic obstructive pulmonary disease, unspecified: Secondary | ICD-10-CM | POA: Insufficient documentation

## 2018-09-06 LAB — CBC
HEMATOCRIT: 36.8 % — AB (ref 39.0–52.0)
Hemoglobin: 11.3 g/dL — ABNORMAL LOW (ref 13.0–17.0)
MCH: 27.3 pg (ref 26.0–34.0)
MCHC: 30.7 g/dL (ref 30.0–36.0)
MCV: 88.9 fL (ref 78.0–100.0)
PLATELETS: 290 10*3/uL (ref 150–400)
RBC: 4.14 MIL/uL — ABNORMAL LOW (ref 4.22–5.81)
RDW: 16.7 % — ABNORMAL HIGH (ref 11.5–15.5)
WBC: 5.7 10*3/uL (ref 4.0–10.5)

## 2018-09-06 LAB — URINALYSIS, ROUTINE W REFLEX MICROSCOPIC
BILIRUBIN URINE: NEGATIVE
Bacteria, UA: NONE SEEN
GLUCOSE, UA: NEGATIVE mg/dL
KETONES UR: 20 mg/dL — AB
Leukocytes, UA: NEGATIVE
Nitrite: NEGATIVE
PROTEIN: NEGATIVE mg/dL
Specific Gravity, Urine: 1.018 (ref 1.005–1.030)
pH: 5 (ref 5.0–8.0)

## 2018-09-06 LAB — COMPREHENSIVE METABOLIC PANEL
ALBUMIN: 3.3 g/dL — AB (ref 3.5–5.0)
ALT: 23 U/L (ref 0–44)
AST: 20 U/L (ref 15–41)
Alkaline Phosphatase: 118 U/L (ref 38–126)
Anion gap: 13 (ref 5–15)
BILIRUBIN TOTAL: 0.8 mg/dL (ref 0.3–1.2)
BUN: 6 mg/dL — AB (ref 8–23)
CHLORIDE: 99 mmol/L (ref 98–111)
CO2: 31 mmol/L (ref 22–32)
Calcium: 9.5 mg/dL (ref 8.9–10.3)
Creatinine, Ser: 1.33 mg/dL — ABNORMAL HIGH (ref 0.61–1.24)
GFR calc Af Amer: >60 mL/min (ref >60)
GFR, EST NON AFRICAN AMERICAN: 53 mL/min — AB (ref >60)
GLUCOSE: 71 mg/dL (ref 70–99)
POTASSIUM: 4.1 mmol/L (ref 3.5–5.1)
Sodium: 143 mmol/L (ref 135–145)
Total Protein: 7.6 g/dL (ref 6.5–8.1)

## 2018-09-06 NOTE — ED Notes (Signed)
Social worker in talking with pt at this time 

## 2018-09-06 NOTE — ED Notes (Signed)
[facility] called to transport pt back to [facility]

## 2018-09-06 NOTE — ED Triage Notes (Signed)
[facility] with [facility]/o abd pain.for several years.  Pt has not had any of his medications in 5 days due to his NG tube being pulled out by the pt.  Pt refused to let them put it back in "because they hurt me".  Pt refuses to go back to [facility].  St's he is not going back there.

## 2018-09-06 NOTE — Progress Notes (Signed)
Trach pt found on venti oxygen device that is used for transport   Changed out cotton dressing from previous facility that was coming apart.  Patient now has new trach  dressing and changed out #[id number] inner cannula (was due for change) suction patients trach with a 14 Fr cath small secretions yellowish..  Patient is on 28% ATC and seems to be more relaxed and in a better mood.  RT will continue to monitor.

## 2018-09-06 NOTE — ED Notes (Signed)
Pt's trach suctioned by resp.

## 2018-09-06 NOTE — ED Provider Notes (Signed)
[facility] Provider Note   CSN: [id number] Arrival date & time: 09/06/18  1604     History   Chief Complaint Unhappy with the medical care at his nursing facility  HPI Kathryne GinCarter M Kassa is a 67 y.o. male.  HPI Patient has a history of multiple medical problems.  He currently resides at [facility] nursing facility.  Patient has a history of chronic hypoxic respiratory failure, he has a tracheostomy.  He has history of COPD, chronic kidney disease, hypertension, chronic ileus with chronic NG tube feedings and a chronic indwelling Foley catheter.  Patient was discharged from the hospital on September 12 after being admitted on February 5.  At that time the patient was admitted to the hospital for sepsis associated with UTI.  Patient states he presents to the emergency room today primarily because not happy with the care at the nursing facility.  Patient states he had his trach replaced and they were rough when doing that.  Patient also states the attempted to place a nasogastric feeding tube several times and were unsuccessful.  Patient states he does not want to go back to that facility.  He currently denies any abdominal pain for me.  He has some chronic abdominal pain issues but that is not an acute problem today.  Known fevers. Past Medical History:  Diagnosis Date  . Atrial fibrillation (HCC)   . COPD (chronic obstructive pulmonary disease) (HCC)   . DVT (deep venous thrombosis) (HCC)   . GERD (gastroesophageal reflux disease)   . Hypertension   . Obesity   . Renal disorder   . Tracheostomy in place [facility](HCC)     Patient Active Problem List   Diagnosis Date Noted  . Sepsis (HCC) 08/20/2018  . Urinary tract infection associated with indwelling urethral catheter (HCC) 08/20/2018  . Anemia 08/20/2018  . Renal insufficiency 08/20/2018  . COPD (chronic obstructive pulmonary disease) (HCC)   . Chronic respiratory failure with hypoxia (HCC)   .  Tracheostomy status (HCC)     History reviewed. No pertinent surgical history.      Home Medications    Prior to Admission medications   Medication Sig Start Date End Date Taking? Authorizing Provider  acetaminophen (TYLENOL) 500 MG tablet Place 500 mg into feeding tube 2 (two) times daily.    [provider]  allopurinol (ZYLOPRIM) 100 MG tablet Place 100 mg into feeding tube every evening.    [provider]  Amino Acids-Protein Hydrolys (FEEDING SUPPLEMENT, PRO-STAT SUGAR FREE 64,) LIQD Place 60 mLs into feeding tube 2 (two) times daily. 08/26/18   Regalado, Belkys A, MD  amiodarone (PACERONE) 200 MG tablet Place 200 mg into feeding tube daily.    [provider]  aspirin 81 MG chewable tablet Chew 81 mg by mouth daily.    [provider]  bisacodyl (DULCOLAX) 10 MG suppository Place 1 suppository (10 mg total) rectally daily as needed for moderate constipation. 08/26/18   Regalado, Belkys A, MD  cefTRIAXone 1 g in sodium chloride 0.9 % 100 mL Inject 1 g into the vein daily. 08/26/18   Regalado, Belkys A, MD  chlorhexidine (PERIDEX) 0.12 % solution Use as directed 15 mLs in the mouth or throat 2 (two) times daily.    [provider]  diphenhydrAMINE (BENADRYL) 25 mg capsule Place 25 mg into feeding tube every 6 (six) hours as needed for itching or allergies.    [provider]  docusate (COLACE) 50 MG/5ML liquid Place  10 mLs (100 mg total) into feeding tube 2 (two) times daily. 08/26/18   Regalado, Belkys A, MD  fluticasone (FLONASE) 50 MCG/ACT nasal spray Place 2 sprays into both nostrils daily.    [provider]  guaifenesin (ROBITUSSIN) 100 MG/5ML syrup Place 200 mg into feeding tube every 4 (four) hours as needed for cough.    [provider]  heparin 5000 UNIT/ML injection Inject 5,000 Units into the skin every 8 (eight) hours.    [provider]  ipratropium-albuterol (DUONEB) 0.5-2.5 (3) MG/3ML SOLN Take  3 mLs by nebulization every 6 (six) hours.    [provider]  lansoprazole (PREVACID SOLUTAB) 30 MG disintegrating tablet Place 30 mg into feeding tube daily.    [provider]  LORazepam (ATIVAN) 1 MG tablet Place 1 mg into feeding tube every 6 (six) hours as needed for anxiety.    [provider]  magnesium hydroxide (MILK OF MAGNESIA) 400 MG/5ML suspension Place 30 mLs into feeding tube daily as needed for mild constipation.    [provider]  Melatonin 3 MG TABS Place 3 mg into feeding tube at bedtime.    [provider]  metoCLOPramide (REGLAN) 5 MG/ML injection Inject 1 mL (5 mg total) into the vein every 8 (eight) hours. 08/26/18   Regalado, Belkys A, MD  metoprolol tartrate (LOPRESSOR) 25 MG tablet Place 25 mg into feeding tube 2 (two) times daily.    [provider]  mineral oil enema Place 133 mLs (1 enema total) rectally daily as needed for severe constipation (If no BM for > 24hrs). 08/26/18   Regalado, Belkys A, MD  Multiple Vitamin (MULTIVITAMIN) LIQD Take 15 mLs by mouth daily. 08/26/18   Regalado, Belkys A, MD  Nutritional Supplements (FEEDING SUPPLEMENT, OSMOLITE 1.5 CAL,) LIQD Place 1,000 mLs into feeding tube continuous.    [provider]  ondansetron (ZOFRAN) 40 MG/20ML SOLN injection Inject 4 mg into the vein every 6 (six) hours as needed for nausea or vomiting.    [provider]  polyethylene glycol (MIRALAX / GLYCOLAX) packet Place 17 g into feeding tube 2 (two) times daily. 08/26/18   Regalado, Belkys A, MD  potassium chloride SA (K-DUR,KLOR-CON) 20 MEQ tablet 20 mEq every evening. Per tube    [provider]  sennosides (SENOKOT) 8.8 MG/5ML syrup Place 5 mLs into feeding tube at bedtime. 08/26/18   Regalado, Belkys A, MD  sertraline (ZOLOFT) 100 MG tablet Place 100 mg into feeding tube at bedtime.    [provider]  simethicone (MYLICON) 40 MG/0.6ML drops Place 1.2 mLs (80 mg total) into  feeding tube 4 (four) times daily. 08/26/18   Regalado, Prentiss Bells, MD    Family History No family history on file.  Social History Social History   Tobacco Use  . Smoking status: Never Smoker  . Smokeless tobacco: Never Used  Substance Use Topics  . Alcohol use: Not Currently  . Drug use: Never     Allergies   Ace inhibitors and Codeine   Review of Systems Review of Systems  All other systems reviewed and are negative.    Physical Exam Updated Vital Signs BP 106/88   Pulse (!) 58   Temp 98.8 F (37.1 C) (Oral)   Resp 17   Wt (!) 149.2 kg   SpO2 100%   BMI 54.74 kg/m   Physical Exam  Constitutional: No distress.  HENT:  Head: Normocephalic and atraumatic.  Right Ear: External ear normal.  Left  Ear: External ear normal.  Tracheostomy in place, no erythema or exudate  Eyes: Conjunctivae are normal. Right eye exhibits no discharge. Left eye exhibits no discharge. No scleral icterus.  Neck: Neck supple. No tracheal deviation present.  Cardiovascular: Normal rate, regular rhythm and intact distal pulses.  Pulmonary/Chest: Effort normal and breath sounds normal. No stridor. No respiratory distress. He has no wheezes. He has no rales.  Abdominal: Soft. Bowel sounds are normal. He exhibits no distension. There is no tenderness. There is no rebound and no guarding.  Ventral hernia, protuberant abdomen  Musculoskeletal: He exhibits no edema or tenderness.  Neurological: He is alert. He displays atrophy. No cranial nerve deficit (no facial droop, extraocular movements intact, no slurred speech) or sensory deficit. He exhibits normal muscle tone. He displays no seizure activity. Coordination normal.  Skin: Skin is warm and dry. No rash noted.  Psychiatric: He has a normal mood and affect.  Nursing note and vitals reviewed.    ED Treatments / Results  Labs (all labs ordered are listed, but only abnormal results are displayed) Labs Reviewed  CBC - Abnormal; Notable for  the following components:      Result Value   RBC 4.14 (*)    Hemoglobin 11.3 (*)    HCT 36.8 (*)    RDW 16.7 (*)    All other components within normal limits  COMPREHENSIVE METABOLIC PANEL - Abnormal; Notable for the following components:   BUN 6 (*)    Creatinine, Ser 1.33 (*)    Albumin 3.3 (*)    GFR calc non Af Amer 53 (*)    All other components within normal limits  URINALYSIS, ROUTINE W REFLEX MICROSCOPIC - Abnormal; Notable for the following components:   Hgb urine dipstick LARGE (*)    Ketones, ur 20 (*)    All other components within normal limits    EKG EKG Interpretation  Date/Time:  Monday September 06 2018 17:08:35 EDT Ventricular Rate:  55 PR Interval:    QRS Duration: 93 QT Interval:  493 QTC Calculation: 472 R Axis:   -52 Text Interpretation:  Sinus rhythm LAD, consider left anterior fascicular block Low voltage, precordial leads Baseline wander in lead(s) V6 No significant change since last tracing Confirmed by Linwood Dibbles [phone]) on 09/06/2018 5:22:21 PM   Radiology Dg Chest 2 View  Result Date: 09/06/2018 CLINICAL DATA:  Abdominal pain for years. EXAM: CHEST - 2 VIEW COMPARISON:  08/21/2018 FINDINGS: Lung volumes are low with crowding of interstitial lung markings. There is mild pulmonary vascular congestion. Hazy opacity at the left base is suspicious for small left effusion. There is probable bibasilar atelectasis. Moderate gaseous distention of large bowel loops in the upper abdomen. No free air. Tracheostomy tube is in place. IMPRESSION: 1. Low lung volumes with vascular congestion and bibasilar atelectasis. Hazy opacity at the left lung base cannot exclude small left effusion. 2. Moderate gaseous distention of the included transverse colon within the upper abdomen. No free air. Electronically Signed   By: Tollie Eth M.D.   On: 09/06/2018 17:52    Procedures Procedures (including critical care time)  Medications Ordered in ED Medications - No data to  display   Initial Impression / Assessment and Plan / ED Course  I have reviewed the triage vital signs and the nursing notes.  Pertinent labs & imaging results that were available during my care of the patient were reviewed by me and considered in my medical decision making (see chart for details).  Clinical Course as of Sep 07 2227  Mon Sep 06, 2018  2222 UA not consistent with UTI   [JK]    Clinical Course User Index [JK] Linwood Dibbles, MD    Patient presented to the emergency room with complaints of concerns about the care he is receiving at the nursing facility.  She does not appear to have any signs of acute infection.  He is breathing at his baseline.  Laboratory tests are unremarkable.  Case management and social worker evaluated the patient emergency  Final Clinical Impressions(s) / ED Diagnoses   Final diagnoses:  Nursing home resident    ED Discharge Orders    None       Linwood Dibbles, MD 09/06/18 2228

## 2018-09-06 NOTE — Discharge Instructions (Signed)
Follow-up with the doctors at the nursing home as we discussed

## 2018-09-06 NOTE — Progress Notes (Signed)
RT NOTE: RT placed patient on trach collar 28% 5L. Patient is sating 100% and in no respiratory distress. RT will continue to monitor.

## 2018-09-06 NOTE — Progress Notes (Addendum)
CSW spoke with pt at pt's bedside. Pt expressed concerns about treatment receiving at [facility]. Pt stated he wants to go to [facility].   CSW called [facility] at [phone]. CSW spoke with respiratory therapist, Alex Reilly. Alex Reilly stated that [facility] is not able to get [facility] to [facility]. Per respiratory therapist, pt frequently refuses treatment. Pt is able to return to [facility].   Update: CSW and RNCM spoke with pt at pt's bedside. Explained to pt that he is limited in where he can go due to trach care and would have to seek placement from SNF due to insurance.   Alex Reilly, Alex Reilly  Emergency Room  [phone]

## 2018-09-06 NOTE — Progress Notes (Signed)
CSW and RNCM met with pt at pt's bedside. CSW and RNCM explained pt returning to [facility]. Staff will reach out to [facility], again to explain pt wants to return to [facility] for continued care. Pt agreeable to returning to [facility].   Wendelyn Breslow, [facility]  [phone]

## 2019-02-13 DEATH — deceased

## 2019-11-04 IMAGING — CT CT ABD-PELV W/ CM
2 of 5 series · 16 of 46 positions shown, 18 images · IV contrast (APPLIED)
Comparison: 07/08/2018 and prior CTs

ADDENDUM:
The IMPRESSION should also include:

Foley catheter tip and balloon within the urethra. Recommend
advancement into the bladder after balloon deflation.
CLINICAL DATA: 66-year-old male with acute abdominal pain with
fever.
EXAM:
CT ABDOMEN AND PELVIS WITH CONTRAST
TECHNIQUE: Multidetector CT imaging of the abdomen and pelvis was performed
using the standard protocol following bolus administration of
intravenous contrast.
CONTRAST:  100mL XMTZ2N-IUU IOPAMIDOL (XMTZ2N-IUU) INJECTION 61%

[Series 3: abd/ pelvis 5.0 i30f 2 · axial · 0.98mm/px · z∈[+791,+1261]mm · 13 of 106 slices shown, 15 images]
[im 6/106  soft-tissue]
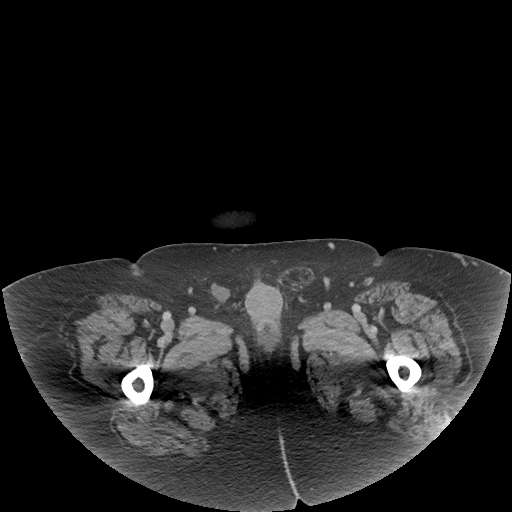
[im 6/106  bone]
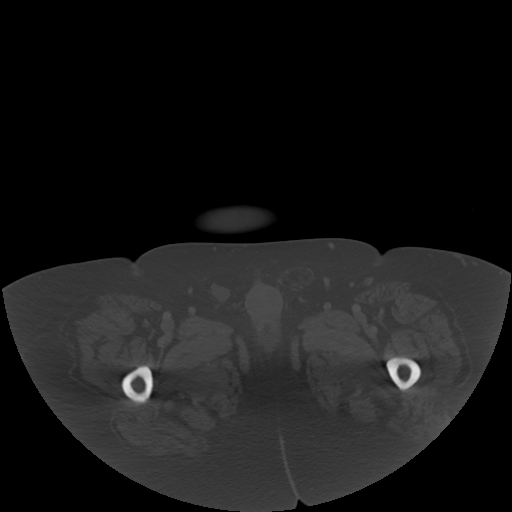
[im 17/106  soft-tissue]
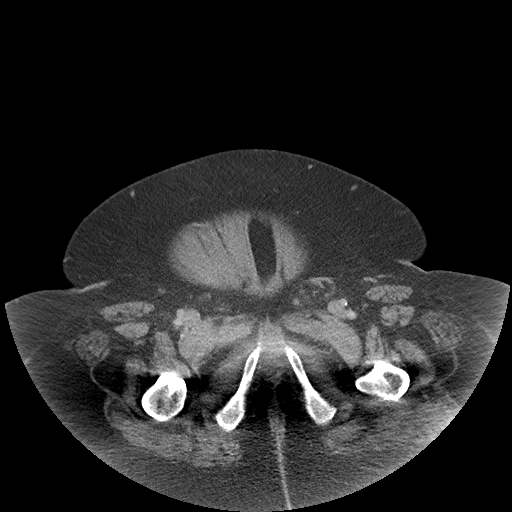
[im 23/106  soft-tissue]
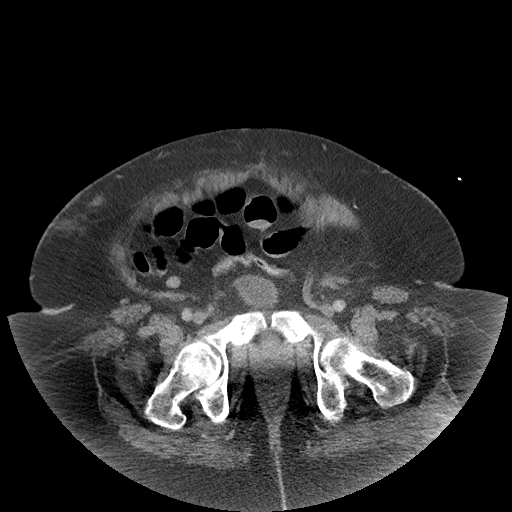
[im 28/106  soft-tissue]
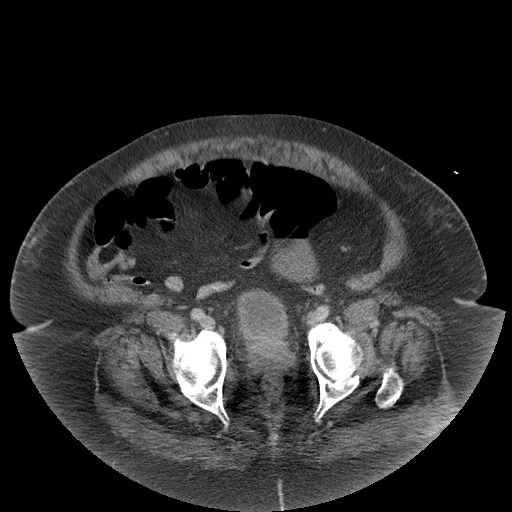
[im 39/106  soft-tissue]
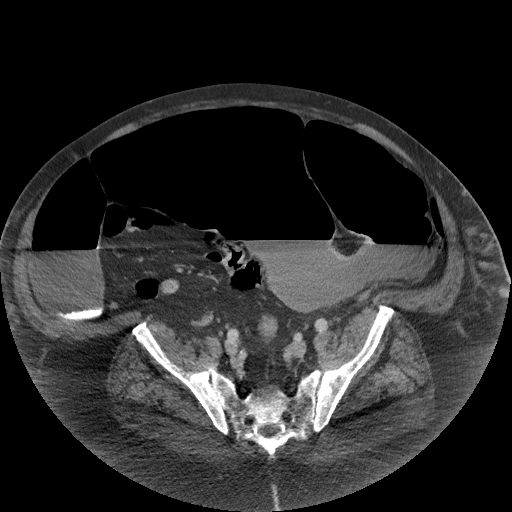
[im 45/106  soft-tissue]
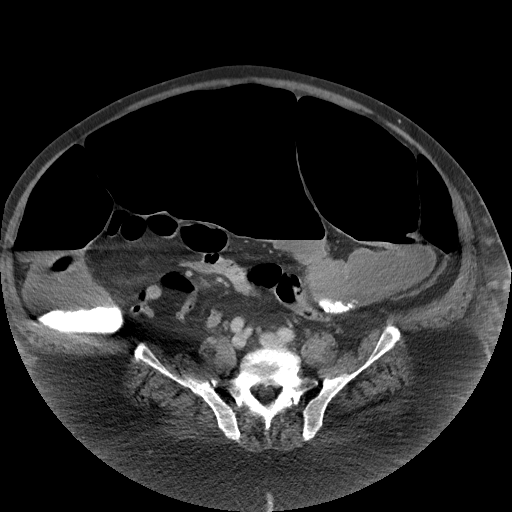
[im 56/106  soft-tissue]
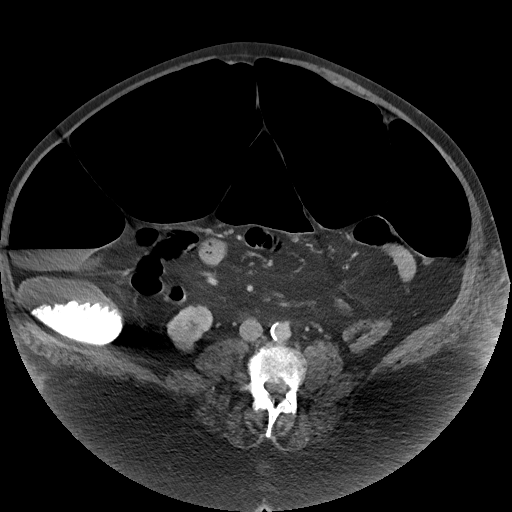
[im 61/106  soft-tissue]
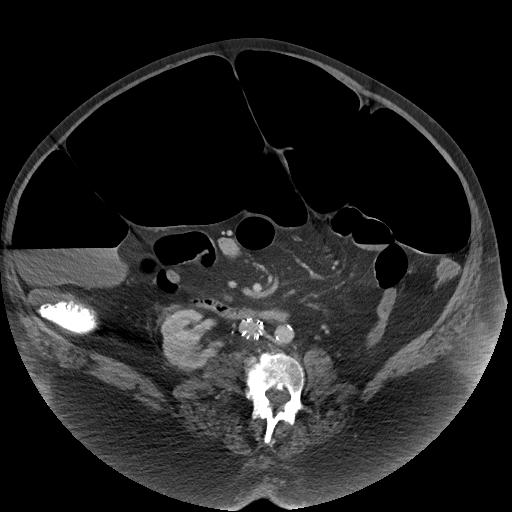
[im 67/106  soft-tissue]
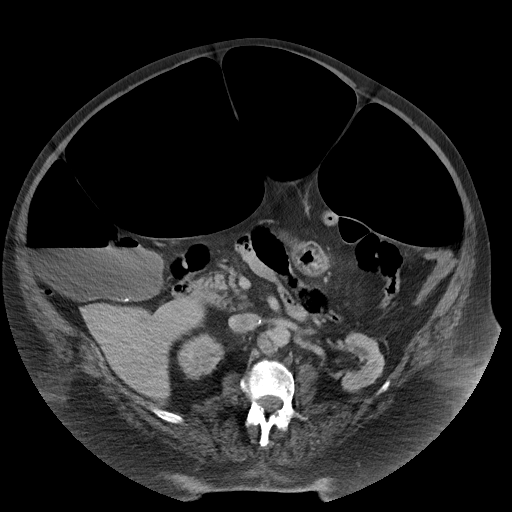
[im 67/106  bone]
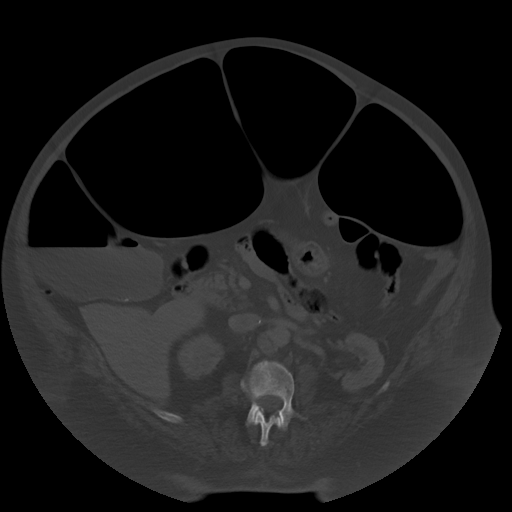
[im 78/106  soft-tissue]
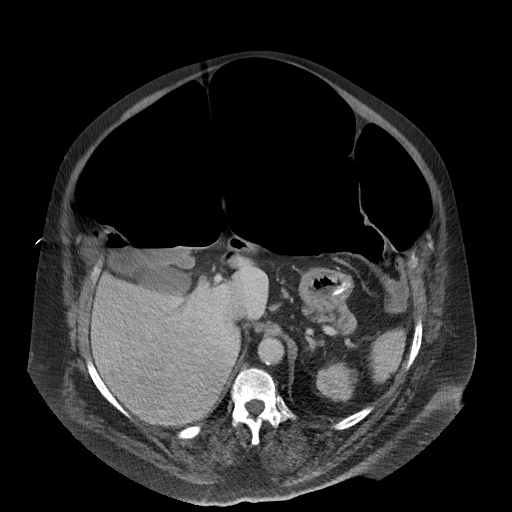
[im 83/106  soft-tissue]
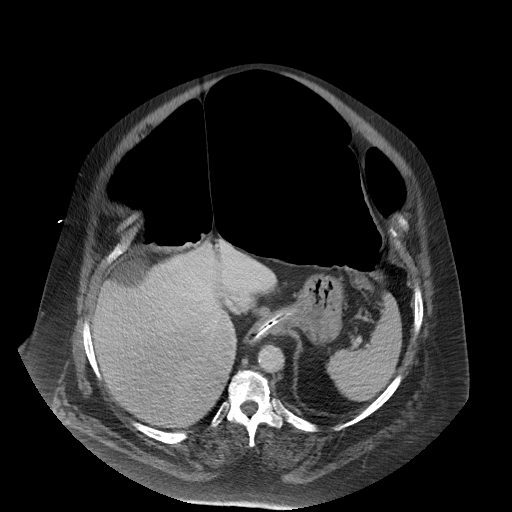
[im 89/106  soft-tissue]
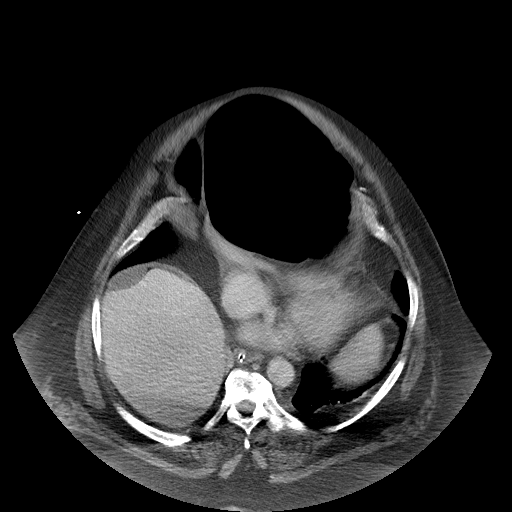
[im 100/106  soft-tissue]
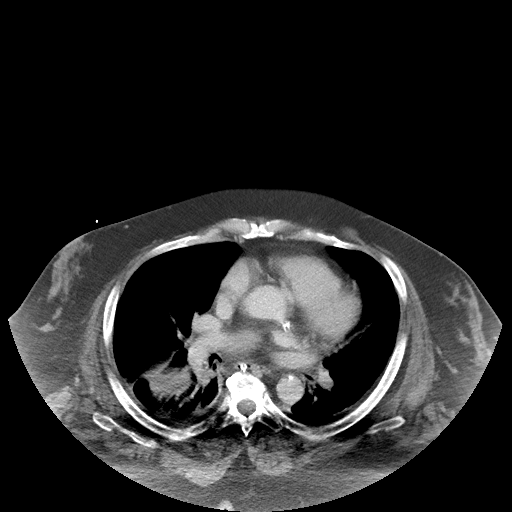

[Series 6: coronal soft tissue · coronal · 0.97mm/px · 3 of 165 slices shown]
[im 55/165  soft-tissue]
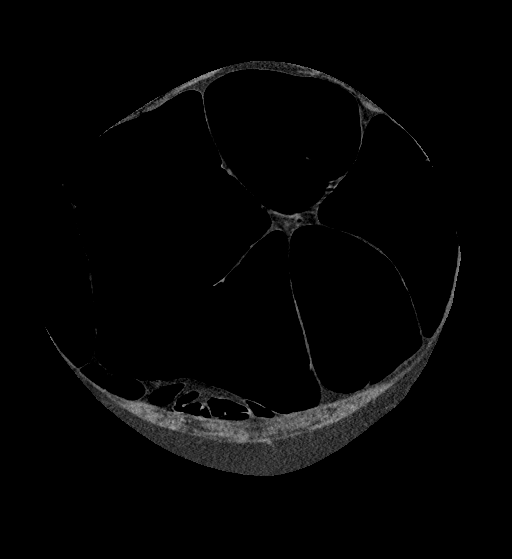
[im 73/165  soft-tissue]
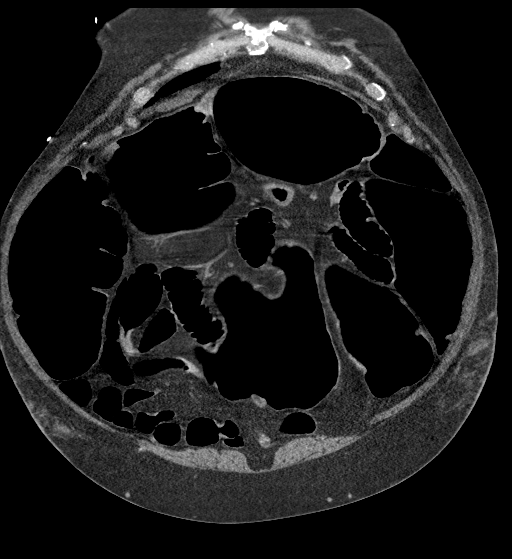
[im 92/165  soft-tissue]
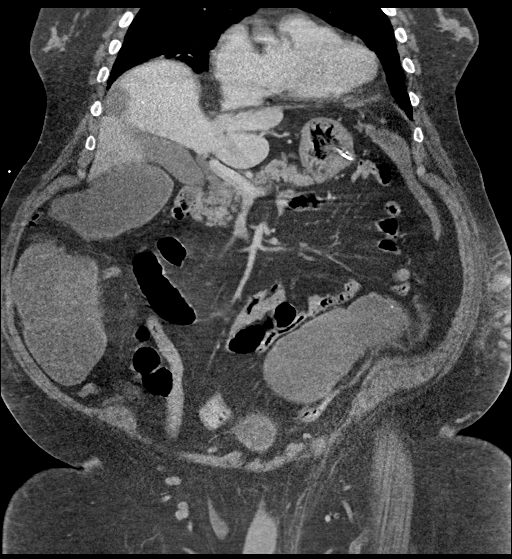

[16 of 46 positions shown; findings below may reference images not displayed]

FINDINGS: Lower chest: Cardiomegaly, coronary artery disease and bibasilar
atelectasis again noted.

Hepatobiliary: No significant hepatic or gallbladder abnormalities.
No biliary dilatation.

Pancreas: Unremarkable

Spleen: Unremarkable

Adrenals/Urinary Tract: The kidneys, adrenal glands and bladder are
unremarkable. A Foley catheter is noted with tip and inflated
balloon in the urethra.

Stomach/Bowel: NG tube within the stomach is noted. Again noted is
marked dilatation of the transverse colon and filled mainly with
gas. Fluid within portions of the ascending and transverse colon are
noted. The transverse colon measures up to 16 cm in greatest
diameter. No small bowel dilatation identified. No definite bowel
wall thickening or inflammatory changes noted.

Vascular/Lymphatic: Aortic atherosclerosis. An IVC filter is again
noted. No enlarged abdominal or pelvic lymph nodes.

Reproductive: No significant prostate abnormalities.

Other: No free fluid, abscess or pneumoperitoneum.

Musculoskeletal: No acute or suspicious bony abnormalities.
IMPRESSION: 1. Unchanged marked dilatation of the colon again noted (primarily
transverse) containing gas and some fluid without obstructing cause.
This is unchanged from 05/06/2018 likely representing a chronic
ileus. No evidence of small bowel obstruction, abscess or
pneumoperitoneum.
2. No acute abnormalities identified.
3. Cardiomegaly, coronary artery disease and unchanged mild
bibasilar atelectasis.
4.  Aortic Atherosclerosis ([id number]-YM2.2).
